# Patient Record
Sex: Male | Born: 1950
Health system: Southern US, Community
[De-identification: ages and names within clinical notes are randomized; demographics above are authoritative.]

## PROBLEM LIST (undated history)

## (undated) DIAGNOSIS — E7800 Pure hypercholesterolemia, unspecified: Secondary | ICD-10-CM

## (undated) DIAGNOSIS — I639 Cerebral infarction, unspecified: Secondary | ICD-10-CM

## (undated) DIAGNOSIS — M179 Osteoarthritis of knee, unspecified: Secondary | ICD-10-CM

## (undated) DIAGNOSIS — R0600 Dyspnea, unspecified: Secondary | ICD-10-CM

## (undated) DIAGNOSIS — Z860100 Personal history of colon polyps, unspecified: Secondary | ICD-10-CM

## (undated) DIAGNOSIS — M1710 Unilateral primary osteoarthritis, unspecified knee: Secondary | ICD-10-CM

## (undated) DIAGNOSIS — E663 Overweight: Secondary | ICD-10-CM

## (undated) DIAGNOSIS — R0609 Other forms of dyspnea: Secondary | ICD-10-CM

## (undated) DIAGNOSIS — M170 Bilateral primary osteoarthritis of knee: Secondary | ICD-10-CM

## (undated) DIAGNOSIS — I771 Stricture of artery: Secondary | ICD-10-CM

## (undated) DIAGNOSIS — M4800 Spinal stenosis, site unspecified: Secondary | ICD-10-CM

## (undated) DIAGNOSIS — R202 Paresthesia of skin: Secondary | ICD-10-CM

## (undated) DIAGNOSIS — D229 Melanocytic nevi, unspecified: Secondary | ICD-10-CM

## (undated) DIAGNOSIS — H9190 Unspecified hearing loss, unspecified ear: Secondary | ICD-10-CM

## (undated) DIAGNOSIS — I10 Essential (primary) hypertension: Secondary | ICD-10-CM

## (undated) DIAGNOSIS — U071 COVID-19: Secondary | ICD-10-CM

## (undated) HISTORY — DX: Overweight: E66.3

## (undated) HISTORY — PX: APPENDECTOMY: SHX54

## (undated) HISTORY — DX: Essential (primary) hypertension: I10

## (undated) HISTORY — DX: Melanocytic nevi, unspecified: D22.9

## (undated) HISTORY — PX: HERNIA REPAIR: SHX51

## (undated) HISTORY — PX: CARDIAC CATHETERIZATION: SHX172

## (undated) HISTORY — DX: Dyspnea, unspecified: R06.00

## (undated) HISTORY — DX: Personal history of colonic polyps: Z86.010

## (undated) HISTORY — DX: Unilateral primary osteoarthritis, unspecified knee: M17.10

## (undated) HISTORY — PX: KNEE SURGERY: SHX244

## (undated) HISTORY — DX: Other forms of dyspnea: R06.09

## (undated) HISTORY — DX: Osteoarthritis of knee, unspecified: M17.9

## (undated) HISTORY — DX: Paresthesia of skin: R20.2

## (undated) HISTORY — DX: Stricture of artery: I77.1

## (undated) HISTORY — DX: Personal history of colon polyps, unspecified: Z86.0100

## (undated) HISTORY — DX: COVID-19: U07.1

## (undated) HISTORY — DX: Bilateral primary osteoarthritis of knee: M17.0

## (undated) HISTORY — DX: Unspecified hearing loss, unspecified ear: H91.90

## (undated) HISTORY — DX: Spinal stenosis, site unspecified: M48.00

## (undated) HISTORY — DX: Pure hypercholesterolemia, unspecified: E78.00

## (undated) HISTORY — PX: SHOULDER SURGERY: SHX246

---

## 2016-10-07 DIAGNOSIS — M545 Low back pain: Secondary | ICD-10-CM | POA: Diagnosis not present

## 2016-10-07 DIAGNOSIS — Z1389 Encounter for screening for other disorder: Secondary | ICD-10-CM | POA: Diagnosis not present

## 2016-10-07 DIAGNOSIS — Z23 Encounter for immunization: Secondary | ICD-10-CM | POA: Diagnosis not present

## 2016-10-07 DIAGNOSIS — E7800 Pure hypercholesterolemia, unspecified: Secondary | ICD-10-CM | POA: Diagnosis not present

## 2016-10-07 DIAGNOSIS — Z0000 Encounter for general adult medical examination without abnormal findings: Secondary | ICD-10-CM | POA: Diagnosis not present

## 2016-10-07 DIAGNOSIS — Z125 Encounter for screening for malignant neoplasm of prostate: Secondary | ICD-10-CM | POA: Diagnosis not present

## 2016-10-07 DIAGNOSIS — I10 Essential (primary) hypertension: Secondary | ICD-10-CM | POA: Diagnosis not present

## 2017-03-16 DIAGNOSIS — Z23 Encounter for immunization: Secondary | ICD-10-CM | POA: Diagnosis not present

## 2017-03-31 DIAGNOSIS — H2513 Age-related nuclear cataract, bilateral: Secondary | ICD-10-CM | POA: Diagnosis not present

## 2017-03-31 DIAGNOSIS — H02831 Dermatochalasis of right upper eyelid: Secondary | ICD-10-CM | POA: Diagnosis not present

## 2017-04-25 DIAGNOSIS — I10 Essential (primary) hypertension: Secondary | ICD-10-CM | POA: Diagnosis not present

## 2017-04-25 DIAGNOSIS — E7800 Pure hypercholesterolemia, unspecified: Secondary | ICD-10-CM | POA: Diagnosis not present

## 2017-04-25 DIAGNOSIS — E663 Overweight: Secondary | ICD-10-CM | POA: Diagnosis not present

## 2017-04-25 DIAGNOSIS — R739 Hyperglycemia, unspecified: Secondary | ICD-10-CM | POA: Diagnosis not present

## 2017-04-27 DIAGNOSIS — R739 Hyperglycemia, unspecified: Secondary | ICD-10-CM | POA: Diagnosis not present

## 2018-02-15 DIAGNOSIS — Z23 Encounter for immunization: Secondary | ICD-10-CM | POA: Diagnosis not present

## 2018-05-22 DIAGNOSIS — I10 Essential (primary) hypertension: Secondary | ICD-10-CM | POA: Diagnosis not present

## 2018-05-22 DIAGNOSIS — R7301 Impaired fasting glucose: Secondary | ICD-10-CM | POA: Diagnosis not present

## 2018-05-22 DIAGNOSIS — E7800 Pure hypercholesterolemia, unspecified: Secondary | ICD-10-CM | POA: Diagnosis not present

## 2018-07-11 ENCOUNTER — Other Ambulatory Visit: Payer: Self-pay | Admitting: Internal Medicine

## 2018-07-11 DIAGNOSIS — Z0000 Encounter for general adult medical examination without abnormal findings: Secondary | ICD-10-CM | POA: Diagnosis not present

## 2018-07-11 DIAGNOSIS — R0989 Other specified symptoms and signs involving the circulatory and respiratory systems: Secondary | ICD-10-CM

## 2018-07-11 DIAGNOSIS — M48061 Spinal stenosis, lumbar region without neurogenic claudication: Secondary | ICD-10-CM | POA: Diagnosis not present

## 2018-07-11 DIAGNOSIS — Z1389 Encounter for screening for other disorder: Secondary | ICD-10-CM | POA: Diagnosis not present

## 2018-07-11 DIAGNOSIS — E663 Overweight: Secondary | ICD-10-CM | POA: Diagnosis not present

## 2018-07-11 DIAGNOSIS — Z125 Encounter for screening for malignant neoplasm of prostate: Secondary | ICD-10-CM | POA: Diagnosis not present

## 2018-07-12 ENCOUNTER — Ambulatory Visit
Admission: RE | Admit: 2018-07-12 | Discharge: 2018-07-12 | Disposition: A | Discharge status: Home / Self Care | Payer: Medicare PPO | Source: Ambulatory Visit | Attending: Internal Medicine | Admitting: Internal Medicine

## 2018-07-12 DIAGNOSIS — R0989 Other specified symptoms and signs involving the circulatory and respiratory systems: Secondary | ICD-10-CM

## 2018-07-12 DIAGNOSIS — I6523 Occlusion and stenosis of bilateral carotid arteries: Secondary | ICD-10-CM | POA: Diagnosis not present

## 2018-07-13 DIAGNOSIS — I771 Stricture of artery: Secondary | ICD-10-CM | POA: Diagnosis not present

## 2019-04-19 DIAGNOSIS — F4321 Adjustment disorder with depressed mood: Secondary | ICD-10-CM | POA: Diagnosis not present

## 2019-04-19 DIAGNOSIS — I10 Essential (primary) hypertension: Secondary | ICD-10-CM | POA: Diagnosis not present

## 2019-04-19 DIAGNOSIS — E785 Hyperlipidemia, unspecified: Secondary | ICD-10-CM | POA: Diagnosis not present

## 2019-04-19 DIAGNOSIS — Z6828 Body mass index (BMI) 28.0-28.9, adult: Secondary | ICD-10-CM | POA: Diagnosis not present

## 2019-04-19 DIAGNOSIS — H9193 Unspecified hearing loss, bilateral: Secondary | ICD-10-CM | POA: Diagnosis not present

## 2019-07-31 DIAGNOSIS — Z125 Encounter for screening for malignant neoplasm of prostate: Secondary | ICD-10-CM | POA: Diagnosis not present

## 2019-07-31 DIAGNOSIS — M9901 Segmental and somatic dysfunction of cervical region: Secondary | ICD-10-CM | POA: Diagnosis not present

## 2019-07-31 DIAGNOSIS — M62838 Other muscle spasm: Secondary | ICD-10-CM | POA: Diagnosis not present

## 2019-07-31 DIAGNOSIS — M542 Cervicalgia: Secondary | ICD-10-CM | POA: Diagnosis not present

## 2019-07-31 DIAGNOSIS — Z86010 Personal history of colonic polyps: Secondary | ICD-10-CM | POA: Diagnosis not present

## 2019-07-31 DIAGNOSIS — Z0000 Encounter for general adult medical examination without abnormal findings: Secondary | ICD-10-CM | POA: Diagnosis not present

## 2019-07-31 DIAGNOSIS — R3915 Urgency of urination: Secondary | ICD-10-CM | POA: Diagnosis not present

## 2019-07-31 DIAGNOSIS — I10 Essential (primary) hypertension: Secondary | ICD-10-CM | POA: Diagnosis not present

## 2019-07-31 DIAGNOSIS — M9902 Segmental and somatic dysfunction of thoracic region: Secondary | ICD-10-CM | POA: Diagnosis not present

## 2019-07-31 DIAGNOSIS — M545 Low back pain: Secondary | ICD-10-CM | POA: Diagnosis not present

## 2019-07-31 DIAGNOSIS — M62830 Muscle spasm of back: Secondary | ICD-10-CM | POA: Diagnosis not present

## 2019-07-31 DIAGNOSIS — M546 Pain in thoracic spine: Secondary | ICD-10-CM | POA: Diagnosis not present

## 2019-07-31 DIAGNOSIS — E7800 Pure hypercholesterolemia, unspecified: Secondary | ICD-10-CM | POA: Diagnosis not present

## 2019-07-31 DIAGNOSIS — I771 Stricture of artery: Secondary | ICD-10-CM | POA: Diagnosis not present

## 2019-07-31 DIAGNOSIS — Z1389 Encounter for screening for other disorder: Secondary | ICD-10-CM | POA: Diagnosis not present

## 2019-07-31 DIAGNOSIS — M9903 Segmental and somatic dysfunction of lumbar region: Secondary | ICD-10-CM | POA: Diagnosis not present

## 2019-08-07 DIAGNOSIS — M62830 Muscle spasm of back: Secondary | ICD-10-CM | POA: Diagnosis not present

## 2019-08-07 DIAGNOSIS — M9903 Segmental and somatic dysfunction of lumbar region: Secondary | ICD-10-CM | POA: Diagnosis not present

## 2019-08-07 DIAGNOSIS — M62838 Other muscle spasm: Secondary | ICD-10-CM | POA: Diagnosis not present

## 2019-08-07 DIAGNOSIS — M545 Low back pain: Secondary | ICD-10-CM | POA: Diagnosis not present

## 2019-08-07 DIAGNOSIS — M542 Cervicalgia: Secondary | ICD-10-CM | POA: Diagnosis not present

## 2019-08-07 DIAGNOSIS — M9901 Segmental and somatic dysfunction of cervical region: Secondary | ICD-10-CM | POA: Diagnosis not present

## 2019-08-07 DIAGNOSIS — M9902 Segmental and somatic dysfunction of thoracic region: Secondary | ICD-10-CM | POA: Diagnosis not present

## 2019-08-07 DIAGNOSIS — M546 Pain in thoracic spine: Secondary | ICD-10-CM | POA: Diagnosis not present

## 2019-08-08 DIAGNOSIS — M9902 Segmental and somatic dysfunction of thoracic region: Secondary | ICD-10-CM | POA: Diagnosis not present

## 2019-08-08 DIAGNOSIS — M546 Pain in thoracic spine: Secondary | ICD-10-CM | POA: Diagnosis not present

## 2019-08-08 DIAGNOSIS — M542 Cervicalgia: Secondary | ICD-10-CM | POA: Diagnosis not present

## 2019-08-08 DIAGNOSIS — M9903 Segmental and somatic dysfunction of lumbar region: Secondary | ICD-10-CM | POA: Diagnosis not present

## 2019-08-08 DIAGNOSIS — M62838 Other muscle spasm: Secondary | ICD-10-CM | POA: Diagnosis not present

## 2019-08-08 DIAGNOSIS — M9901 Segmental and somatic dysfunction of cervical region: Secondary | ICD-10-CM | POA: Diagnosis not present

## 2019-08-08 DIAGNOSIS — M545 Low back pain: Secondary | ICD-10-CM | POA: Diagnosis not present

## 2019-08-08 DIAGNOSIS — M62830 Muscle spasm of back: Secondary | ICD-10-CM | POA: Diagnosis not present

## 2019-08-13 DIAGNOSIS — M546 Pain in thoracic spine: Secondary | ICD-10-CM | POA: Diagnosis not present

## 2019-08-13 DIAGNOSIS — M542 Cervicalgia: Secondary | ICD-10-CM | POA: Diagnosis not present

## 2019-08-13 DIAGNOSIS — M62838 Other muscle spasm: Secondary | ICD-10-CM | POA: Diagnosis not present

## 2019-08-13 DIAGNOSIS — M9901 Segmental and somatic dysfunction of cervical region: Secondary | ICD-10-CM | POA: Diagnosis not present

## 2019-08-13 DIAGNOSIS — M62830 Muscle spasm of back: Secondary | ICD-10-CM | POA: Diagnosis not present

## 2019-08-13 DIAGNOSIS — M9902 Segmental and somatic dysfunction of thoracic region: Secondary | ICD-10-CM | POA: Diagnosis not present

## 2019-08-13 DIAGNOSIS — M545 Low back pain: Secondary | ICD-10-CM | POA: Diagnosis not present

## 2019-08-13 DIAGNOSIS — M9903 Segmental and somatic dysfunction of lumbar region: Secondary | ICD-10-CM | POA: Diagnosis not present

## 2019-08-15 DIAGNOSIS — M62838 Other muscle spasm: Secondary | ICD-10-CM | POA: Diagnosis not present

## 2019-08-15 DIAGNOSIS — M545 Low back pain: Secondary | ICD-10-CM | POA: Diagnosis not present

## 2019-08-15 DIAGNOSIS — M9901 Segmental and somatic dysfunction of cervical region: Secondary | ICD-10-CM | POA: Diagnosis not present

## 2019-08-15 DIAGNOSIS — M9903 Segmental and somatic dysfunction of lumbar region: Secondary | ICD-10-CM | POA: Diagnosis not present

## 2019-08-15 DIAGNOSIS — M9902 Segmental and somatic dysfunction of thoracic region: Secondary | ICD-10-CM | POA: Diagnosis not present

## 2019-08-15 DIAGNOSIS — M546 Pain in thoracic spine: Secondary | ICD-10-CM | POA: Diagnosis not present

## 2019-08-15 DIAGNOSIS — M62830 Muscle spasm of back: Secondary | ICD-10-CM | POA: Diagnosis not present

## 2019-08-15 DIAGNOSIS — M542 Cervicalgia: Secondary | ICD-10-CM | POA: Diagnosis not present

## 2019-08-20 DIAGNOSIS — M545 Low back pain: Secondary | ICD-10-CM | POA: Diagnosis not present

## 2019-08-20 DIAGNOSIS — M62830 Muscle spasm of back: Secondary | ICD-10-CM | POA: Diagnosis not present

## 2019-08-20 DIAGNOSIS — M9901 Segmental and somatic dysfunction of cervical region: Secondary | ICD-10-CM | POA: Diagnosis not present

## 2019-08-20 DIAGNOSIS — M546 Pain in thoracic spine: Secondary | ICD-10-CM | POA: Diagnosis not present

## 2019-08-20 DIAGNOSIS — M542 Cervicalgia: Secondary | ICD-10-CM | POA: Diagnosis not present

## 2019-08-20 DIAGNOSIS — M62838 Other muscle spasm: Secondary | ICD-10-CM | POA: Diagnosis not present

## 2019-08-20 DIAGNOSIS — M9903 Segmental and somatic dysfunction of lumbar region: Secondary | ICD-10-CM | POA: Diagnosis not present

## 2019-08-20 DIAGNOSIS — M9902 Segmental and somatic dysfunction of thoracic region: Secondary | ICD-10-CM | POA: Diagnosis not present

## 2019-08-22 DIAGNOSIS — M545 Low back pain: Secondary | ICD-10-CM | POA: Diagnosis not present

## 2019-08-22 DIAGNOSIS — M9902 Segmental and somatic dysfunction of thoracic region: Secondary | ICD-10-CM | POA: Diagnosis not present

## 2019-08-22 DIAGNOSIS — M546 Pain in thoracic spine: Secondary | ICD-10-CM | POA: Diagnosis not present

## 2019-08-22 DIAGNOSIS — M9903 Segmental and somatic dysfunction of lumbar region: Secondary | ICD-10-CM | POA: Diagnosis not present

## 2019-08-22 DIAGNOSIS — M62838 Other muscle spasm: Secondary | ICD-10-CM | POA: Diagnosis not present

## 2019-08-22 DIAGNOSIS — M9901 Segmental and somatic dysfunction of cervical region: Secondary | ICD-10-CM | POA: Diagnosis not present

## 2019-08-22 DIAGNOSIS — M62830 Muscle spasm of back: Secondary | ICD-10-CM | POA: Diagnosis not present

## 2019-08-22 DIAGNOSIS — M542 Cervicalgia: Secondary | ICD-10-CM | POA: Diagnosis not present

## 2019-08-27 DIAGNOSIS — M9902 Segmental and somatic dysfunction of thoracic region: Secondary | ICD-10-CM | POA: Diagnosis not present

## 2019-08-27 DIAGNOSIS — M545 Low back pain: Secondary | ICD-10-CM | POA: Diagnosis not present

## 2019-08-27 DIAGNOSIS — M9903 Segmental and somatic dysfunction of lumbar region: Secondary | ICD-10-CM | POA: Diagnosis not present

## 2019-08-27 DIAGNOSIS — M546 Pain in thoracic spine: Secondary | ICD-10-CM | POA: Diagnosis not present

## 2019-08-27 DIAGNOSIS — M9901 Segmental and somatic dysfunction of cervical region: Secondary | ICD-10-CM | POA: Diagnosis not present

## 2019-08-27 DIAGNOSIS — M62838 Other muscle spasm: Secondary | ICD-10-CM | POA: Diagnosis not present

## 2019-08-27 DIAGNOSIS — M62830 Muscle spasm of back: Secondary | ICD-10-CM | POA: Diagnosis not present

## 2019-08-27 DIAGNOSIS — M542 Cervicalgia: Secondary | ICD-10-CM | POA: Diagnosis not present

## 2019-08-29 DIAGNOSIS — M546 Pain in thoracic spine: Secondary | ICD-10-CM | POA: Diagnosis not present

## 2019-08-29 DIAGNOSIS — M542 Cervicalgia: Secondary | ICD-10-CM | POA: Diagnosis not present

## 2019-08-29 DIAGNOSIS — M9903 Segmental and somatic dysfunction of lumbar region: Secondary | ICD-10-CM | POA: Diagnosis not present

## 2019-08-29 DIAGNOSIS — M9901 Segmental and somatic dysfunction of cervical region: Secondary | ICD-10-CM | POA: Diagnosis not present

## 2019-08-29 DIAGNOSIS — M62830 Muscle spasm of back: Secondary | ICD-10-CM | POA: Diagnosis not present

## 2019-08-29 DIAGNOSIS — M9902 Segmental and somatic dysfunction of thoracic region: Secondary | ICD-10-CM | POA: Diagnosis not present

## 2019-08-29 DIAGNOSIS — M62838 Other muscle spasm: Secondary | ICD-10-CM | POA: Diagnosis not present

## 2019-08-29 DIAGNOSIS — M545 Low back pain: Secondary | ICD-10-CM | POA: Diagnosis not present

## 2019-09-05 DIAGNOSIS — M9902 Segmental and somatic dysfunction of thoracic region: Secondary | ICD-10-CM | POA: Diagnosis not present

## 2019-09-05 DIAGNOSIS — M9903 Segmental and somatic dysfunction of lumbar region: Secondary | ICD-10-CM | POA: Diagnosis not present

## 2019-09-05 DIAGNOSIS — M545 Low back pain: Secondary | ICD-10-CM | POA: Diagnosis not present

## 2019-09-05 DIAGNOSIS — M62838 Other muscle spasm: Secondary | ICD-10-CM | POA: Diagnosis not present

## 2019-09-05 DIAGNOSIS — M9901 Segmental and somatic dysfunction of cervical region: Secondary | ICD-10-CM | POA: Diagnosis not present

## 2019-09-05 DIAGNOSIS — M62830 Muscle spasm of back: Secondary | ICD-10-CM | POA: Diagnosis not present

## 2019-09-05 DIAGNOSIS — M546 Pain in thoracic spine: Secondary | ICD-10-CM | POA: Diagnosis not present

## 2019-09-05 DIAGNOSIS — M542 Cervicalgia: Secondary | ICD-10-CM | POA: Diagnosis not present

## 2020-02-18 DIAGNOSIS — Z20828 Contact with and (suspected) exposure to other viral communicable diseases: Secondary | ICD-10-CM | POA: Diagnosis not present

## 2020-02-26 DIAGNOSIS — T162XXA Foreign body in left ear, initial encounter: Secondary | ICD-10-CM | POA: Diagnosis not present

## 2020-04-08 DIAGNOSIS — M1712 Unilateral primary osteoarthritis, left knee: Secondary | ICD-10-CM | POA: Diagnosis not present

## 2020-04-08 DIAGNOSIS — S83512A Sprain of anterior cruciate ligament of left knee, initial encounter: Secondary | ICD-10-CM | POA: Diagnosis not present

## 2020-04-22 DIAGNOSIS — S83512D Sprain of anterior cruciate ligament of left knee, subsequent encounter: Secondary | ICD-10-CM | POA: Diagnosis not present

## 2020-05-06 DIAGNOSIS — M1712 Unilateral primary osteoarthritis, left knee: Secondary | ICD-10-CM | POA: Diagnosis not present

## 2020-05-06 DIAGNOSIS — M25561 Pain in right knee: Secondary | ICD-10-CM | POA: Diagnosis not present

## 2020-06-21 DIAGNOSIS — U071 COVID-19: Secondary | ICD-10-CM | POA: Diagnosis not present

## 2020-06-21 DIAGNOSIS — R509 Fever, unspecified: Secondary | ICD-10-CM | POA: Diagnosis not present

## 2020-06-21 DIAGNOSIS — J029 Acute pharyngitis, unspecified: Secondary | ICD-10-CM | POA: Diagnosis not present

## 2020-06-21 DIAGNOSIS — R059 Cough, unspecified: Secondary | ICD-10-CM | POA: Diagnosis not present

## 2020-07-10 DIAGNOSIS — U071 COVID-19: Secondary | ICD-10-CM | POA: Diagnosis not present

## 2020-07-10 DIAGNOSIS — G933 Postviral fatigue syndrome: Secondary | ICD-10-CM | POA: Diagnosis not present

## 2020-08-14 ENCOUNTER — Ambulatory Visit
Admission: RE | Admit: 2020-08-14 | Discharge: 2020-08-14 | Disposition: A | Discharge status: Home / Self Care | Payer: Medicare PPO | Source: Ambulatory Visit | Attending: Family Medicine | Admitting: Family Medicine

## 2020-08-14 ENCOUNTER — Other Ambulatory Visit: Payer: Self-pay | Admitting: Family Medicine

## 2020-08-14 DIAGNOSIS — R0609 Other forms of dyspnea: Secondary | ICD-10-CM

## 2020-08-14 DIAGNOSIS — R0602 Shortness of breath: Secondary | ICD-10-CM | POA: Diagnosis not present

## 2020-08-14 DIAGNOSIS — Z23 Encounter for immunization: Secondary | ICD-10-CM | POA: Diagnosis not present

## 2020-08-14 DIAGNOSIS — R0600 Dyspnea, unspecified: Secondary | ICD-10-CM | POA: Diagnosis not present

## 2020-08-14 DIAGNOSIS — D229 Melanocytic nevi, unspecified: Secondary | ICD-10-CM | POA: Diagnosis not present

## 2020-08-14 DIAGNOSIS — I10 Essential (primary) hypertension: Secondary | ICD-10-CM | POA: Diagnosis not present

## 2020-08-14 DIAGNOSIS — Z0000 Encounter for general adult medical examination without abnormal findings: Secondary | ICD-10-CM | POA: Diagnosis not present

## 2020-08-14 DIAGNOSIS — E7800 Pure hypercholesterolemia, unspecified: Secondary | ICD-10-CM | POA: Diagnosis not present

## 2020-08-14 DIAGNOSIS — M170 Bilateral primary osteoarthritis of knee: Secondary | ICD-10-CM | POA: Diagnosis not present

## 2020-08-14 DIAGNOSIS — Z1159 Encounter for screening for other viral diseases: Secondary | ICD-10-CM | POA: Diagnosis not present

## 2020-08-14 DIAGNOSIS — Z125 Encounter for screening for malignant neoplasm of prostate: Secondary | ICD-10-CM | POA: Diagnosis not present

## 2020-08-14 DIAGNOSIS — R202 Paresthesia of skin: Secondary | ICD-10-CM | POA: Diagnosis not present

## 2020-08-25 ENCOUNTER — Telehealth: Payer: Self-pay

## 2020-08-25 NOTE — Telephone Encounter (Signed)
NOTES ON FILE FROM [facility] [phone], REFERRAL IN PROFICIENT

## 2020-08-27 ENCOUNTER — Telehealth: Payer: Self-pay

## 2020-08-27 NOTE — Telephone Encounter (Signed)
NOTES ON FILE FROM DR RACHEAL HAGER, REFERRAL IN PRIFICIENT

## 2020-08-29 ENCOUNTER — Other Ambulatory Visit: Payer: Self-pay

## 2020-08-29 ENCOUNTER — Encounter: Payer: Self-pay | Admitting: Cardiology

## 2020-08-29 ENCOUNTER — Ambulatory Visit: Payer: Medicare HMO | Admitting: Cardiology

## 2020-08-29 VITALS — BP 140/90 | HR 67 | Ht 71.0 in | Wt 209.0 lb

## 2020-08-29 DIAGNOSIS — E7800 Pure hypercholesterolemia, unspecified: Secondary | ICD-10-CM

## 2020-08-29 DIAGNOSIS — I10 Essential (primary) hypertension: Secondary | ICD-10-CM

## 2020-08-29 DIAGNOSIS — R0789 Other chest pain: Secondary | ICD-10-CM | POA: Diagnosis not present

## 2020-08-29 DIAGNOSIS — R0600 Dyspnea, unspecified: Secondary | ICD-10-CM

## 2020-08-29 DIAGNOSIS — Z79899 Other long term (current) drug therapy: Secondary | ICD-10-CM

## 2020-08-29 DIAGNOSIS — R0609 Other forms of dyspnea: Secondary | ICD-10-CM

## 2020-08-29 MED ORDER — METOPROLOL TARTRATE 100 MG PO TABS: ORAL_TABLET | ORAL | 0 refills | Status: DC

## 2020-08-29 NOTE — Patient Instructions (Signed)
Medication Instructions:  Your physician recommends that you continue on your current medications as directed. Please refer to the Current Medication list given to you today.  *If you need a refill on your cardiac medications before your next appointment, please call your pharmacy*   Lab Work BMET prior to your CT  If you have labs (blood work) drawn today and your tests are completely normal, you will receive your results only by: Marland Kitchen MyChart Message (if you have MyChart) OR . A paper copy in the mail If you have any lab test that is abnormal or we need to change your treatment, we will call you to review the results.   Testing/Procedures: Your physician has requested that you have an echocardiogram. Echocardiography is a painless test that uses sound waves to create images of your heart. It provides your doctor with information about the size and shape of your heart and how well your heart's chambers and valves are working. This procedure takes approximately one hour. There are no restrictions for this procedure.  Your cardiac CT will be scheduled at one of the below locations:   [facility] [address] [place], [place] [address] [phone]  [place] [address] [place], [place] [address] [phone]  If scheduled at [facility], please arrive at the [facility] main entrance (entrance A) of [facility] 30 minutes prior to test start time. Proceed to the [facility] Radiology Department (first floor) to check-in and test prep.  If scheduled at [facility], please arrive 15 mins early for check-in and test prep.  Please follow these instructions carefully (unless otherwise directed):   On the Night Before the Test: . Be sure to Drink plenty of water. . Do not consume any caffeinated/decaffeinated beverages or chocolate 12 hours prior to your test. . Do not take  any antihistamines 12 hours prior to your test.  On the Day of the Test: . Drink plenty of water until 1 hour prior to the test. . Do not eat any food 4 hours prior to the test. . You may take your regular medications prior to the test.  . Take metoprolol (Lopressor) two hours prior to test. . HOLD Furosemide/Hydrochlorothiazide morning of the test.  After the Test: . Drink plenty of water. . After receiving IV contrast, you may experience a mild flushed feeling. This is normal. . On occasion, you may experience a mild rash up to 24 hours after the test. This is not dangerous. If this occurs, you can take Benadryl 25 mg and increase your fluid intake. . If you experience trouble breathing, this can be serious. If it is severe call 911 IMMEDIATELY. If it is mild, please call our office. . If you take any of these medications: Glipizide/Metformin, Avandament, Glucavance, please do not take 48 hours after completing test unless otherwise instructed.   Once we have confirmed authorization from your insurance company, we will call you to set up a date and time for your test. Based on how quickly your insurance processes prior authorizations requests, please allow up to 4 weeks to be contacted for scheduling your Cardiac CT appointment. Be advised that routine Cardiac CT appointments could be scheduled as many as 8 weeks after your provider has ordered it.  For non-scheduling related questions, please contact the cardiac imaging nurse navigator should you have any questions/concerns: Marchia Bond, Cardiac Imaging Nurse Navigator Gordy Clement, Cardiac Imaging Nurse  Navigator [facility] Direct Office Dial: [phone]   For scheduling needs, including cancellations and rescheduling, please call [place], [phone].     Follow-Up: At [facility], you and your health needs are our priority.  As part of our continuing mission to provide you with exceptional  heart care, we have created designated Provider Care Teams.  These Care Teams include your primary Cardiologist (physician) and Advanced Practice Providers (APPs -  Physician Assistants and Nurse Practitioners) who all work together to provide you with the care you need, when you need it.  We recommend signing up for the patient portal called "MyChart".  Sign up information is provided on this After Visit Summary.  MyChart is used to connect with patients for Virtual Visits (Telemedicine).  Patients are able to view lab/test results, encounter notes, upcoming appointments, etc.  Non-urgent messages can be sent to your provider as well.   To learn more about what you can do with MyChart, go to [email or web address].    Your next appointment:   8 month(s)  The format for your next appointment:   In Person  Provider:   Gwyndolyn Kaufman, MD   Other Instructions

## 2020-08-29 NOTE — Progress Notes (Signed)
Cardiology Office Note:    Date:  08/29/2020   ID:  [name], DOB 27-Mar-1951, MRN [id number]  PCP:  Caren Macadam, [place]  Cardiologist:  No primary care provider on file.  Advanced Practice Provider:  No care team member to display Electrophysiologist:  None    Referring MD: Caren Macadam, MD     History of Present Illness:    [name] is a 70 y.o. male with a hx of HTN, HLD,and subclavian stenosis who was referred by Dr. Mannie Stabile for further evaluation of dyspnea on exertion.  The patient states he has noticed more shortness of breath with exertion over the past several months as well as decreased exercise capacity. Used to be able to bike 65 miles in a day 2 years ago and now he is more SOB when walking an incline. No chest pain, lightheadedness, LE edema, orthopnea, PND. Still works out 3 days a week for 2 hours and tries to get 10,000 steps per day but he notices that he is much more fatigued with activity compared to where he used to be. No chest pain, orthopnea, PND, palpitations, lightheadedness or dizziness.  Had prior cath about 12 years ago after he was very weak with shoveling the snow which was reassuringly normal.   Family history: Father with CAD; brother with HLD and HTN  LDL 102, HDL 40, TG 143, A1C 5.4. BNP normal.  Past Medical History:  Diagnosis Date  . COVID   . DJD (degenerative joint disease) of knee   . Dyspnea on exertion   . Hearing loss   . Hypercholesteremia   . Hypertension   . Overweight   . Paresthesias   . Personal history of colonic polyps   . Primary osteoarthritis of both knees   . Skin mole   . Spinal stenosis   . Stenosis of right subclavian artery (HCC)     Current Medications: Current Meds  Medication Sig  . Apple Cider Vinegar 600 MG CAPS Take by mouth.  Marland Kitchen aspirin EC 81 MG tablet Take 81 mg by mouth daily. Swallow whole.  . Cholecalciferol 125 MCG (5000 UT) capsule Take 5,000  Units by mouth daily.  . Glucosamine-Chondroit-Vit C-Mn (GLUCOSAMINE 1500 COMPLEX PO) Take by mouth.  . hydrochlorothiazide (HYDRODIURIL) 12.5 MG tablet Take 12.5 mg by mouth daily.  Marland Kitchen losartan (COZAAR) 100 MG tablet Take 100 mg by mouth daily.  . Lutein-Zeaxanthin 25-5 MG CAPS Take by mouth.  . Magnesium 400 MG CAPS Take by mouth.  . metoprolol tartrate (LOPRESSOR) 100 MG tablet Take one tablet 2 hours prior to your Cardiac CT.  . Multiple Vitamin (MULTIVITAMIN) tablet Take 1 tablet by mouth daily.  . simvastatin (ZOCOR) 20 MG tablet Take 20 mg by mouth daily.     Allergies:   Patient has no known allergies.   Social History   Socioeconomic History  . Marital status: Married    Spouse name: Not on file  . Number of children: Not on file  . Years of education: Not on file  . Highest education level: Not on file  Occupational History  . Not on file  Tobacco Use  . Smoking status: Never Smoker  . Smokeless tobacco: Never Used  Substance and Sexual Activity  . Alcohol use: Yes  . Drug use: Never  . Sexual activity: Not on file  Other Topics Concern  . Not on file  Social History Narrative  . Not on file  Social Determinants of Health   Financial Resource Strain: Not on file  Food Insecurity: Not on file  Transportation Needs: Not on file  Physical Activity: Not on file  Stress: Not on file  Social Connections: Not on file     Family History: The patient's family history includes CVA in his brother.  ROS:   Please see the history of present illness.    Review of Systems  Constitutional: Negative for chills and fever.  HENT: Negative for hearing loss and sore throat.   Eyes: Negative for blurred vision and redness.  Respiratory: Positive for shortness of breath.   Cardiovascular: Negative for chest pain, palpitations, orthopnea, claudication, leg swelling and PND.  Gastrointestinal: Negative for melena, nausea and vomiting.  Genitourinary: Negative for dysuria and  flank pain.  Musculoskeletal: Negative for falls and myalgias.  Neurological: Negative for dizziness and loss of consciousness.  Endo/Heme/Allergies: Negative for polydipsia.  Psychiatric/Behavioral: Negative for substance abuse.    EKGs/Labs/Other Studies Reviewed:    The following studies were reviewed today: 07/12/18: IMPRESSION: Right: Color duplex indicates minimal heterogeneous and calcified plaque, with no hemodynamically significant stenosis by duplex criteria in the right extracranial cerebrovascular circulation.  Left: Color duplex indicates minimal heterogeneous and calcified plaque, with no hemodynamically significant stenosis by duplex criteria in the left extracranial cerebrovascular circulation.  Late systolic reversal of the right vertebral artery with antegrade flow in diastole. This waveform indicates subclavian artery or innominate artery stenosis proximal to the right vertebral artery origin. Further evaluation with CT a chest may be useful if there is concern for symptoms.  EKG:  EKG is  ordered today.  The ekg ordered today demonstrates NSR with HR 67  Recent Labs: No results found for requested labs within last 8760 hours.  Recent Lipid Panel No results found for: CHOL, TRIG, HDL, CHOLHDL, VLDL, LDLCALC, LDLDIRECT   Physical Exam:    VS:  BP 140/90 (BP Location: Left Arm, Patient Position: Sitting, Cuff Size: Normal)   Pulse 67   Ht 5\' 11"  (1.803 m)   Wt 209 lb (94.8 kg)   SpO2 97%   BMI 29.15 kg/m     Wt Readings from Last 3 Encounters:  08/29/20 209 lb (94.8 kg)     GEN:  Well nourished, well developed in no acute distress HEENT: Normal NECK: No JVD; No carotid bruits CARDIAC: RRR, no murmurs, rubs, gallops RESPIRATORY:  Clear to auscultation without rales, wheezing or rhonchi  ABDOMEN: Soft, non-tender, non-distended MUSCULOSKELETAL:  No edema; No deformity  SKIN: Warm and dry NEUROLOGIC:  Alert and oriented x 3 PSYCHIATRIC:  Normal  affect   ASSESSMENT:    1. Dyspnea on exertion   2. Other chest pain   3. Medication management   4. Primary hypertension   5. Pure hypercholesterolemia    PLAN:    In order of problems listed above:  #DOE: Patient with progressive dyspnea on exertion over the past several months and decreased exercise tolerance. Notes that he used to be able to bike ride for significant distances and now he gets so short of breath he has had to cut his mileage down significantly. Had prior cath about 12 years ago which did not show significant disease per report. -Check coronary CTA -Check TTE  #HTN:  Well controlled.  -HCTZ 12.5mg  daily -Losartan 100mg  daily  #HLD: -Continue simvastatin 20mg  daily--may need to adjust pending CTA findings     Medication Adjustments/Labs and Tests Ordered: Current medicines are reviewed at length with the  patient today.  Concerns regarding medicines are outlined above.  Orders Placed This Encounter  Procedures  . CT CORONARY FRACTIONAL FLOW RESERVE DATA PREP  . CT CORONARY FRACTIONAL FLOW RESERVE FLUID ANALYSIS  . CT CORONARY MORPH W/CTA COR W/SCORE W/CA W/CM &/OR WO/CM  . Basic metabolic panel  . EKG 12-Lead  . ECHOCARDIOGRAM COMPLETE   Meds ordered this encounter  Medications  . metoprolol tartrate (LOPRESSOR) 100 MG tablet    Sig: Take one tablet 2 hours prior to your Cardiac CT.    Dispense:  1 tablet    Refill:  0    Patient Instructions  Medication Instructions:  Your physician recommends that you continue on your current medications as directed. Please refer to the Current Medication list given to you today.  *If you need a refill on your cardiac medications before your next appointment, please call your pharmacy*   Lab Work BMET prior to your CT  If you have labs (blood work) drawn today and your tests are completely normal, you will receive your results only by: Marland Kitchen MyChart Message (if you have MyChart) OR . A paper copy in the  mail If you have any lab test that is abnormal or we need to change your treatment, we will call you to review the results.   Testing/Procedures: Your physician has requested that you have an echocardiogram. Echocardiography is a painless test that uses sound waves to create images of your heart. It provides your doctor with information about the size and shape of your heart and how well your heart's chambers and valves are working. This procedure takes approximately one hour. There are no restrictions for this procedure.  Your cardiac CT will be scheduled at one of the below locations:   [facility] [address] [place], [place] [address] [phone]  [place] [address] [place],  [address] [phone]  If scheduled at [facility], please arrive at the [facility] main entrance (entrance A) of [facility] 30 minutes prior to test start time. Proceed to the [facility] Radiology Department (first floor) to check-in and test prep.  If scheduled at [facility], please arrive 15 mins early for check-in and test prep.  Please follow these instructions carefully (unless otherwise directed):   On the Night Before the Test: . Be sure to Drink plenty of water. . Do not consume any caffeinated/decaffeinated beverages or chocolate 12 hours prior to your test. . Do not take any antihistamines 12 hours prior to your test.  On the Day of the Test: . Drink plenty of water until 1 hour prior to the test. . Do not eat any food 4 hours prior to the test. . You may take your regular medications prior to the test.  . Take metoprolol (Lopressor) two hours prior to test. . HOLD Furosemide/Hydrochlorothiazide morning of the test.  After the Test: . Drink plenty of water. . After receiving IV contrast, you may experience a mild flushed feeling. This is normal. . On occasion,  you may experience a mild rash up to 24 hours after the test. This is not dangerous. If this occurs, you can take Benadryl 25 mg and increase your fluid intake. . If you experience trouble breathing, this can be serious. If it is severe call 911 IMMEDIATELY. If it is mild, please call our office. . If you take any of these medications: Glipizide/Metformin, Avandament, Glucavance, please do  not take 48 hours after completing test unless otherwise instructed.   Once we have confirmed authorization from your insurance company, we will call you to set up a date and time for your test. Based on how quickly your insurance processes prior authorizations requests, please allow up to 4 weeks to be contacted for scheduling your Cardiac CT appointment. Be advised that routine Cardiac CT appointments could be scheduled as many as 8 weeks after your provider has ordered it.  For non-scheduling related questions, please contact the cardiac imaging nurse navigator should you have any questions/concerns: Marchia Bond, Cardiac Imaging Nurse Navigator Gordy Clement, Cardiac Imaging Nurse Navigator [facility] Direct Office Dial: [phone]   For scheduling needs, including cancellations and rescheduling, please call [place], [phone].     Follow-Up: At [facility], you and your health needs are our priority.  As part of our continuing mission to provide you with exceptional heart care, we have created designated Provider Care Teams.  These Care Teams include your primary Cardiologist (physician) and Advanced Practice Providers (APPs -  Physician Assistants and Nurse Practitioners) who all work together to provide you with the care you need, when you need it.  We recommend signing up for the patient portal called "MyChart".  Sign up information is provided on this After Visit Summary.  MyChart is used to connect with patients for Virtual Visits (Telemedicine).  Patients are  able to view lab/test results, encounter notes, upcoming appointments, etc.  Non-urgent messages can be sent to your provider as well.   To learn more about what you can do with MyChart, go to [email or web address].    Your next appointment:   8 month(s)  The format for your next appointment:   In Person  Provider:   Gwyndolyn Kaufman, MD   Other Instructions      Signed, Freada Bergeron, MD  08/29/2020 3:14 PM    [place]

## 2020-09-15 DIAGNOSIS — R748 Abnormal levels of other serum enzymes: Secondary | ICD-10-CM | POA: Diagnosis not present

## 2020-09-15 DIAGNOSIS — Z1211 Encounter for screening for malignant neoplasm of colon: Secondary | ICD-10-CM | POA: Diagnosis not present

## 2020-09-15 DIAGNOSIS — E7800 Pure hypercholesterolemia, unspecified: Secondary | ICD-10-CM | POA: Diagnosis not present

## 2020-09-15 DIAGNOSIS — I10 Essential (primary) hypertension: Secondary | ICD-10-CM | POA: Diagnosis not present

## 2020-09-16 ENCOUNTER — Other Ambulatory Visit: Payer: Medicare HMO

## 2020-09-16 ENCOUNTER — Telehealth: Payer: Self-pay | Admitting: *Deleted

## 2020-09-16 ENCOUNTER — Other Ambulatory Visit: Payer: Self-pay

## 2020-09-16 DIAGNOSIS — R0789 Other chest pain: Secondary | ICD-10-CM

## 2020-09-16 DIAGNOSIS — Z79899 Other long term (current) drug therapy: Secondary | ICD-10-CM | POA: Diagnosis not present

## 2020-09-16 LAB — BASIC METABOLIC PANEL
BUN/Creatinine Ratio: 22 (ref 10–24)
BUN: 23 mg/dL (ref 8–27)
CO2: 24 mmol/L (ref 20–29)
Calcium: 9.5 mg/dL (ref 8.6–10.2)
Chloride: 105 mmol/L (ref 96–106)
Creatinine, Ser: 1.05 mg/dL (ref 0.76–1.27)
Glucose: 100 mg/dL — ABNORMAL HIGH (ref 65–99)
Potassium: 4.9 mmol/L (ref 3.5–5.2)
Sodium: 144 mmol/L (ref 134–144)
eGFR: 76 mL/min/{1.73_m2} (ref >59)

## 2020-09-16 NOTE — Telephone Encounter (Signed)
Pt will come today 4/12 to the office for his BMET, for upcoming Cardiac CT on 4/14, per CT protocol. Pt verbalized understanding and agrees with this plan.

## 2020-09-16 NOTE — Telephone Encounter (Signed)
-----   Message from Eli Hose, RN sent at 09/16/2020  9:36 AM EDT ----- [name],  This pt of Dr. Johney Frame is having her CCTA this Thursday, April 14.  Can you reach out for him to come in for labs?  Thanks, Kerin Ransom

## 2020-09-17 ENCOUNTER — Telehealth (HOSPITAL_COMMUNITY): Payer: Self-pay | Admitting: *Deleted

## 2020-09-17 NOTE — Telephone Encounter (Signed)
Reaching out to patient to offer assistance regarding upcoming cardiac imaging study; pt verbalizes understanding of appt date/time, parking situation and where to check in, pre-test NPO status and medications ordered, and verified current allergies; name and call back number provided for further questions should they arise  Gordy Clement RN Navigator Cardiac Imaging [facility] [phone] office [phone] cell  Pt to take 100mg  metoprolol tartrate 2 hours prior to test.

## 2020-09-18 ENCOUNTER — Ambulatory Visit (HOSPITAL_COMMUNITY)
Admission: RE | Admit: 2020-09-18 | Discharge: 2020-09-18 | Disposition: A | Discharge status: Home / Self Care | Payer: Medicare HMO | Source: Ambulatory Visit | Attending: Cardiology | Admitting: Cardiology

## 2020-09-18 ENCOUNTER — Other Ambulatory Visit: Payer: Self-pay

## 2020-09-18 ENCOUNTER — Encounter (HOSPITAL_COMMUNITY): Payer: Self-pay

## 2020-09-18 DIAGNOSIS — I25118 Atherosclerotic heart disease of native coronary artery with other forms of angina pectoris: Secondary | ICD-10-CM | POA: Insufficient documentation

## 2020-09-18 DIAGNOSIS — R0789 Other chest pain: Secondary | ICD-10-CM | POA: Diagnosis not present

## 2020-09-18 DIAGNOSIS — I700 Atherosclerosis of aorta: Secondary | ICD-10-CM | POA: Diagnosis not present

## 2020-09-18 DIAGNOSIS — I2510 Atherosclerotic heart disease of native coronary artery without angina pectoris: Secondary | ICD-10-CM | POA: Diagnosis not present

## 2020-09-18 DIAGNOSIS — Z006 Encounter for examination for normal comparison and control in clinical research program: Secondary | ICD-10-CM

## 2020-09-18 MED ORDER — NITROGLYCERIN 0.4 MG SL SUBL
0.8000 mg | SUBLINGUAL_TABLET | Freq: Once | SUBLINGUAL | Status: AC
  Administered 2020-09-18: 0.8 mg via SUBLINGUAL

## 2020-09-18 MED ORDER — IOHEXOL 350 MG/ML SOLN
95.0000 mL | Freq: Once | INTRAVENOUS | Status: AC | PRN
  Administered 2020-09-18: 95 mL via INTRAVENOUS

## 2020-09-18 MED ORDER — NITROGLYCERIN 0.4 MG SL SUBL
SUBLINGUAL_TABLET | SUBLINGUAL | Status: AC
  Filled 2020-09-18: qty 2

## 2020-09-18 NOTE — Research (Signed)
IDENTIFY Informed Consent                  Subject Name: [name]   Subject met inclusion and exclusion criteria.  The informed consent form, study requirements and expectations were reviewed with the subject and questions and concerns were addressed prior to the signing of the consent form.  The subject verbalized understanding of the trial requirements.  The subject agreed to participate in the IDENTIFY trial and signed the informed consent at 08:27AM on 09/18/20.  The informed consent was obtained prior to performance of any protocol-specific procedures for the subject.  A copy of the signed informed consent was given to the subject and a copy was placed in the subject's medical record.   Meade Maw , Naval architect

## 2020-09-22 ENCOUNTER — Telehealth: Payer: Self-pay

## 2020-09-22 DIAGNOSIS — E7800 Pure hypercholesterolemia, unspecified: Secondary | ICD-10-CM

## 2020-09-22 MED ORDER — ROSUVASTATIN CALCIUM 20 MG PO TABS: 20.0000 mg | ORAL_TABLET | Freq: Every day | ORAL | 3 refills | Status: DC

## 2020-09-22 NOTE — Telephone Encounter (Signed)
Call back received from Pt.  Pt advised of CT results and Dr. Jacolyn Reedy recommendations.  Pt in agreement.  New prescription sent to pharmacy and follow up lab work scheduled.

## 2020-09-22 NOTE — Telephone Encounter (Signed)
-----   Message from Freada Bergeron, MD sent at 09/20/2020  8:23 AM EDT ----- His coronary CTA looks good with only mild plaque in his heart arteries but no blockage to flow. Given that there is mild disease present, we should switch him to a stronger statin. Can we stop his simvastatin and change him to crestor 20mg  daily and repeat lipids in 6-8 weeks?

## 2020-09-22 NOTE — Telephone Encounter (Signed)
Left message for Pt requesting call back to discuss results.

## 2020-10-02 ENCOUNTER — Ambulatory Visit (HOSPITAL_COMMUNITY): Payer: Medicare HMO | Attending: Cardiology

## 2020-10-02 ENCOUNTER — Other Ambulatory Visit: Payer: Self-pay

## 2020-10-02 DIAGNOSIS — R0789 Other chest pain: Secondary | ICD-10-CM | POA: Diagnosis not present

## 2020-10-02 LAB — ECHOCARDIOGRAM COMPLETE
Area-P 1/2: 3.72 cm2
S' Lateral: 3.7 cm

## 2020-10-08 DIAGNOSIS — X32XXXA Exposure to sunlight, initial encounter: Secondary | ICD-10-CM | POA: Diagnosis not present

## 2020-10-08 DIAGNOSIS — L821 Other seborrheic keratosis: Secondary | ICD-10-CM | POA: Diagnosis not present

## 2020-10-08 DIAGNOSIS — L570 Actinic keratosis: Secondary | ICD-10-CM | POA: Diagnosis not present

## 2020-10-08 DIAGNOSIS — L820 Inflamed seborrheic keratosis: Secondary | ICD-10-CM | POA: Diagnosis not present

## 2020-10-15 DIAGNOSIS — H524 Presbyopia: Secondary | ICD-10-CM | POA: Diagnosis not present

## 2020-11-18 ENCOUNTER — Other Ambulatory Visit: Payer: Self-pay

## 2020-11-18 ENCOUNTER — Other Ambulatory Visit: Payer: Medicare HMO | Admitting: *Deleted

## 2020-11-18 DIAGNOSIS — E7800 Pure hypercholesterolemia, unspecified: Secondary | ICD-10-CM

## 2020-11-18 LAB — LIPID PANEL
Chol/HDL Ratio: 3 ratio (ref 0.0–5.0)
Cholesterol, Total: 119 mg/dL (ref 100–199)
HDL: 40 mg/dL (ref >39)
LDL Chol Calc (NIH): 63 mg/dL (ref 0–99)
Triglycerides: 79 mg/dL (ref 0–149)
VLDL Cholesterol Cal: 16 mg/dL (ref 5–40)

## 2020-12-03 ENCOUNTER — Other Ambulatory Visit: Payer: Self-pay

## 2020-12-03 MED ORDER — ROSUVASTATIN CALCIUM 20 MG PO TABS: 20.0000 mg | ORAL_TABLET | Freq: Every day | ORAL | 2 refills | Status: DC

## 2021-03-04 DIAGNOSIS — K006 Disturbances in tooth eruption: Secondary | ICD-10-CM | POA: Diagnosis not present

## 2021-03-04 DIAGNOSIS — R69 Illness, unspecified: Secondary | ICD-10-CM | POA: Diagnosis not present

## 2021-03-17 DIAGNOSIS — Z23 Encounter for immunization: Secondary | ICD-10-CM | POA: Diagnosis not present

## 2021-03-17 DIAGNOSIS — Z1159 Encounter for screening for other viral diseases: Secondary | ICD-10-CM | POA: Diagnosis not present

## 2021-03-17 DIAGNOSIS — I10 Essential (primary) hypertension: Secondary | ICD-10-CM | POA: Diagnosis not present

## 2021-03-17 DIAGNOSIS — E7800 Pure hypercholesterolemia, unspecified: Secondary | ICD-10-CM | POA: Diagnosis not present

## 2021-03-17 DIAGNOSIS — M170 Bilateral primary osteoarthritis of knee: Secondary | ICD-10-CM | POA: Diagnosis not present

## 2021-04-08 DIAGNOSIS — M170 Bilateral primary osteoarthritis of knee: Secondary | ICD-10-CM | POA: Diagnosis not present

## 2021-04-08 DIAGNOSIS — M1712 Unilateral primary osteoarthritis, left knee: Secondary | ICD-10-CM | POA: Diagnosis not present

## 2021-04-22 DIAGNOSIS — M25562 Pain in left knee: Secondary | ICD-10-CM | POA: Diagnosis not present

## 2021-04-22 DIAGNOSIS — M25561 Pain in right knee: Secondary | ICD-10-CM | POA: Diagnosis not present

## 2021-05-28 DIAGNOSIS — M25562 Pain in left knee: Secondary | ICD-10-CM | POA: Diagnosis not present

## 2021-05-28 DIAGNOSIS — M25561 Pain in right knee: Secondary | ICD-10-CM | POA: Diagnosis not present

## 2021-07-29 ENCOUNTER — Other Ambulatory Visit: Payer: Self-pay | Admitting: *Deleted

## 2021-07-29 MED ORDER — ROSUVASTATIN CALCIUM 20 MG PO TABS: 20.0000 mg | ORAL_TABLET | Freq: Every day | ORAL | 0 refills | Status: DC

## 2021-10-12 ENCOUNTER — Other Ambulatory Visit: Payer: Self-pay | Admitting: *Deleted

## 2021-10-12 MED ORDER — ROSUVASTATIN CALCIUM 20 MG PO TABS: 20.0000 mg | ORAL_TABLET | Freq: Every day | ORAL | 0 refills | Status: DC

## 2021-11-03 ENCOUNTER — Other Ambulatory Visit: Payer: Self-pay | Admitting: *Deleted

## 2021-11-03 MED ORDER — ROSUVASTATIN CALCIUM 20 MG PO TABS: 20.0000 mg | ORAL_TABLET | Freq: Every day | ORAL | 0 refills | Status: DC

## 2022-01-05 ENCOUNTER — Other Ambulatory Visit: Payer: Self-pay | Admitting: Physician Assistant

## 2022-01-05 DIAGNOSIS — M21371 Foot drop, right foot: Secondary | ICD-10-CM

## 2022-02-18 ENCOUNTER — Encounter: Payer: Self-pay | Admitting: Neurology

## 2022-02-18 ENCOUNTER — Ambulatory Visit: Payer: Medicare HMO | Admitting: Neurology

## 2022-02-18 VITALS — BP 138/83 | HR 69 | Ht 71.0 in | Wt 213.6 lb

## 2022-02-18 DIAGNOSIS — M5417 Radiculopathy, lumbosacral region: Secondary | ICD-10-CM | POA: Diagnosis not present

## 2022-02-18 NOTE — Patient Instructions (Addendum)
Orders Placed This Encounter  Procedures   Ambulatory referral to Neurosurgery   Patient with an L5 right radic causing right foot drop and sensory changes in an L5 distribution. His whole right leg hurts (not classically radicular but does describe pain extending from the low back to the foot) I do NOT believe this is peroneal neuropath but feel this is L5 radic: he has weakness of foot and toe dorsiflexion, weakness of foot eversion AND inversion as well as leg flexion weakness and absent right Aj reflex with consistent findings on MRI showing severe right medial neuroforaminal narrowing impinging and cranially and dorsally displacing the exiting right L5 nerve roots.  - They would like referral to Dr. Trenton Gammon and I agree considering surgical options is valid and needs appointment with Dr. Trenton Gammon.      Most people have heard of sciatica and may have experienced it at some point. Sciatica refers to a sharp, shooting pain that runs from your lower back down the side or the back of your leg.  Sciatica is named after the sciatic nerve, which is the largest nerve in your body. It is about the diameter of your little finger and composed of five nerve roots that start in your lower spine. These nerve roots combine to form this large nerve that branches from your lower back through your hips and buttocks and down each leg.  Causes Despite the name, sciatica is not typically caused by a problem with the sciatic nerve. In most cases, it is caused by compression of one of the nerve roots that make up the sciatic nerve, usually the last lumbar nerve root ? L5 ? or the first sacral nerve root ? S1 ? as they exit the spine. The term "pinched nerve" is commonly used when describing the condition. The medical term for the condition is radiculopathy.  The most common cause of radiculopathy is a herniated lumbar disc. When disc material protrudes from the disc space into the spinal canal, it can compress a nerve  root. Radiculopathy can be caused by an accident, injury or lifting a large amount of weight improperly. However, most commonly people cannot link the onset of their symptoms to any event. They simply wake up one day to pain radiating down one leg.  Other causes of sciatica include lumbar stenosis, which is narrowing of the spinal canal leading to pinching of the nerve root, and spondylolisthesis, which is a slip forward of one vertebra on another.  Symptoms When a nerve root is compressed, people experience three general symptoms: pain, numbness and weakness. Not everyone will experience all three symptoms.  Pain is the most common symptom, and it is usually a sharp, shooting pain along the nerve. It can vary widely, from a mild ache to a sharp, burning sensation or excruciating pain. Sometimes it can feel like a jolt or electric shock. It can be worse when you cough or sneeze, and prolonged sitting can aggravate symptoms. Usually only one side of your body is affected.  If the last lumbar nerve root is being compressed ? L5 radiculopathy ? the pain generally runs down the outside of side of the leg. If the first sacral nerve root is being compressed ? S1 radiculopathy ? the pain normally radiates down the back of the leg.  Numbness also is a common symptom. This can show as decreased sensation or tingling in the same way as sciatic pain. L5 radiculopathy is usually associated with numbness down the side of the leg  and into the top of the foot. S1 radiculopathy typically results in numbness down the back of the leg into the outside or bottom of the foot.  Weakness is another symptom of nerve root compression. However, it is less common than pain and numbness. This displays as decreased function in the muscles supplied by the nerve root that is compressed. For L5 radiculopathy, this is usually weakness with bending the foot back toward your head. When severe, this can cause a "foot drop" with the foot  slapping the ground when you walk. For S1 radiculopathy, there can be weakness with flexing the foot forward, like pushing down on a gas pedal.  Treatments A range of treatments is available for radiculopathy, including: Activity modification An important first step is to modify your activities, such as avoiding bending, lifting and twisting activities. This minimizes further irritation of the nerve root and allows your body to heal. Medications Over-the-counter medications, such as acetaminophen and ibuprofen, can effectively reduce pain. Your health care provider also may prescribe other medications, such as oral steroids, to reduce inflammation or a muscle relaxer if you are experiencing spasms in the muscles in your back. Generally, sciatic pain and radiculopathy are not treated with opioid pain medications. Physical therapy and chiropractic therapy These therapies also can effectively treat lower back problems, including radiculopathy. Steroid injections Some people find relief from targeted steroid injections into the lumbar spine directly around the nerve root that is irritated and compressed. Corticosteroid injections reduce pain by suppressing inflammation around the irritated nerve. Surgery This is an excellent treatment for L5 or S1 radiculopathy when other conservative, nonsurgical treatments have not worked. There are generally two reasons for surgery. The first is significant weakness, such as a foot drop. In these cases, the sooner pressure is off the nerve, the more likely you can fully recover. The second more common reason to have surgery is if conservative treatments have failed. Usually, the most conservative treatments are recommended first and more invasive treatments are only pursued if necessary. Most cases of L5 or S1 radiculopathy will resolve within a few weeks or months.

## 2022-02-18 NOTE — Progress Notes (Signed)
[facility]    Provider:  Dr Jaynee Eagles Requesting Provider: Olen Cordial, PA Primary Care Provider:  Caren Macadam, MD  CC:  L5 radiculopathy  HPI:  [name] is a 71 y.o. male here as requested by Olen Cordial, PA for Foot drop.  He has a past medical history of hypertension, hypercholesterolemia, overweight, spinal stenosis, hearing loss/hearing aids, white coat syndrome, he has been to [facility] in November 2022 and December 2022 to Dr. Reynaldo Minium for left knee pain and steroid injections, right knee also had steroid injections but did not improve, in January 2023 saw Dr. Reynaldo Minium for meniscal tear and arthroscopic debridement.  I reviewed Dr. Roma Kayser and Hetty Blend Turmel's notes, patient presented with weakness and a deep ache in his right ankle and foot, ongoing since June 28, at that time he had a Doppler ultrasound for DVT which was negative and was prescribed naproxen, he asked for a referral to a neurologist or spine specialist for evaluation due to concern of a possibly pinched nerve affecting his leg, he has the worst pain he has been experiencing in his hip as well but notes he has never had pain in his back.  He has weakness in the right ankle and foot and is unable to walk on his heel without his foot drop, pre-CBC evaluated by physical therapy, constant dull ache in his right leg, worse with activity and standing, improved with rest.   He had the knee surgery in February, cortizone injections of the right foot. In May he saw Al[facility] again, he had pain in the knee and had medial bursitis and got a shot medially in the knee, in June he was playing golf, walking fine, walking a lot and doing lots of physical work, his right foot was swollen, then his right calf was pain and throbbing but no heat no swelling, they went to the ED and neg for DVT and sent to an orthopaedist in [place] and was in PT he went July 8th and noticed he had a drop foot (in restrospect possibly had it  at the end of June), PT diagnosed with drop foot, the knee specialist recommended an MRI lumbar spine, in the meantime they were working with [facility] for the knees, knee injections laterally around fib head AFTER the foot drop started. He was given neurontin and a steroid and he felt improvement in the right toe weakness also had numbness and tingling top of right foot and the bottom as well. As a result, MRI was done, he saw Dr. Nelva Bush who did not think injections were warranted due to no pain in the low back or radicular symptoms. But he does have aching pain down the entire leg. No other focal neurologic deficits, associated symptoms, inciting events or modifiable factors.    Reviewed notes, labs and imaging from outside physicians, which showed:  MRI was completed at [facility] completed there which shows a likely L5 radiculopathy which can cause his symptoms:   Small right foraminal/extraforaminal disc extrusion with mild cranial migration with conjoined right L5-S1 nerve root sleeve resulting in severe right medial neuroforaminal narrowing impinging and cranially and dorsally displacing the exiting right L5 nerve roots with moderate left neuroforaminal narrowing. Moderate to severe left and mild to moderate right L4-L5 neuroforaminal narrowing Moderate to severe left and mild right L3-L4 neuroforaminal narrowing with mild impingement and dorsal lateral displacement of the exiting left L3 nerve roots.  Moderate to severe left subarticular zone narrowing with impingement and mild  dorsal medial displacement of descending left L4 nerve roots.  Mild right subarticular zone narrowing without spinal canal stenosis. Moderate right and mild to moderate left L2-L3 neural foraminal narrowing.  Mild to moderate right and mild left subarticular zone narrowing contacting and minimally displacing descending right L3 nerve roots without spinal canal stenosis. Mild left L1-L2 neuroforaminal  narrowing. Mild dextroconvex lumbar scoliosis, apex centered at L3.  Mild left lateral listhesis of L2 on L3.  Mild multilevel retrolisthesis.  B12 791    Review of Systems: Patient complains of symptoms per HPI as well as the following symptoms foot weakness. Pertinent negatives and positives per HPI. All others negative.   Social History   Socioeconomic History   Marital status: Married    Spouse name: Not on file   Number of children: Not on file   Years of education: Not on file   Highest education level: Not on file  Occupational History   Not on file  Tobacco Use   Smoking status: Never   Smokeless tobacco: Never  Vaping Use   Vaping Use: Never used  Substance and Sexual Activity   Alcohol use: Yes    Comment: occasional   Drug use: Never   Sexual activity: Not on file  Other Topics Concern   Not on file  Social History Narrative   Caffeine occasional coffee   Education: masters.   Retired. From ministry.    Social Determinants of Health   Financial Resource Strain: Not on file  Food Insecurity: Not on file  Transportation Needs: Not on file  Physical Activity: Not on file  Stress: Not on file  Social Connections: Not on file  Intimate Partner Violence: Not on file    Family History  Problem Relation Age of Onset   CVA Brother     Past Medical History:  Diagnosis Date   COVID    DJD (degenerative joint disease) of knee    Dyspnea on exertion    Hearing loss    Hypercholesteremia    Hypertension    Overweight    Paresthesias    Personal history of colonic polyps    Primary osteoarthritis of both knees    Skin mole    Spinal stenosis    Stenosis of right subclavian artery [facility])     Patient Active Problem List   Diagnosis Date Noted   Lumbosacral radiculopathy at L5 02/18/2022    Past Surgical History:  Procedure Laterality Date   APPENDECTOMY     CARDIAC CATHETERIZATION     HERNIA REPAIR     KNEE SURGERY Right    SHOULDER SURGERY  Left     Current Outpatient Medications  Medication Sig Dispense Refill   aspirin EC 81 MG tablet Take 81 mg by mouth daily. Swallow whole.     gabapentin (NEURONTIN) 300 MG capsule Take 300 mg by mouth 3 (three) times daily.     hydrochlorothiazide (HYDRODIURIL) 12.5 MG tablet Take 12.5 mg by mouth daily.     losartan (COZAAR) 100 MG tablet Take 100 mg by mouth daily.     Multiple Vitamin (MULTIVITAMIN) tablet Take 1 tablet by mouth daily.     rosuvastatin (CRESTOR) 20 MG tablet Take 1 tablet (20 mg total) by mouth daily. Please schedule appointment for future refills. Thank you (Patient not taking: Reported on 02/18/2022) 15 tablet 0   No current facility-administered medications for this visit.    Allergies as of 02/18/2022   (No Known Allergies)    Vitals:  BP 138/83   Pulse 69   Ht '5\' 11"'$  (1.803 m)   Wt 213 lb 9.6 oz (96.9 kg)   BMI 29.79 kg/m  Last Weight:  Wt Readings from Last 1 Encounters:  02/18/22 213 lb 9.6 oz (96.9 kg)   Last Height:   Ht Readings from Last 1 Encounters:  02/18/22 '5\' 11"'$  (1.803 m)     Physical exam: Exam: Gen: NAD, conversant, well nourised,  well groomed                     CV: RRR, no MRG. No Carotid Bruits. No peripheral edema, warm, nontender Eyes: Conjunctivae clear without exudates or hemorrhage  Neuro: Detailed Neurologic Exam  Speech:    Speech is normal; fluent and spontaneous with normal comprehension.  Cognition:    The patient is oriented to person, place, and time;     recent and remote memory intact;     language fluent;     normal attention, concentration,     fund of knowledge Cranial Nerves:    The pupils are equal, round, and reactive to light. Attempted . Visual fields are full to finger confrontation. Extraocular movements are intact. Trigeminal sensation is intact and the muscles of mastication are normal. The face is symmetric. The palate elevates in the midline. Hearing intact. Voice is normal. Shoulder shrug is  normal. The tongue has normal motion without fasciculations.   Coordination:    Normal   Gait:     steppage.   Motor Observation:    No asymmetry, no atrophy, and no involuntary movements noted. Tone:    Normal muscle tone.    Posture:    Posture is normal. normal erect    Strength: right leg leg flexion weakness. 2+/5 dorsiflexion foot , 3/5 great toe. Weakness if right foot eversion and inversion. Otherwise strength is V/V in the upper and lower limbs.      Sensation: decreased in an L5 distribution.      Reflex Exam:  DTR's: right patellar 2+, left patellar 2+, right AJ absent, leftAJ trace to 1+ (had to use dendritic maneuver), upper     Toes:    The toes are downgoing bilaterally.   Clonus:    Clonus is absent.    Assessment/Plan:  Patient with an L5 right radic causing right foot drop and sensory changes in an L5 distribution. His whole right leg hurts (not classically radicular but does describe pain extending from the low back to the foot) I do NOT believe this is peroneal neuropath but feel this is L5 radic: he has weakness of foot and toe dorsiflexion, weakness of foot eversion AND inversion as well as leg flexion weakness and absent right Aj reflex with consistent findings on MRI showing severe right medial neuroforaminal narrowing impinging and cranially and dorsally displacing the exiting right L5 nerve roots.  - They would like referral to Dr. Trenton Gammon and I agree considering surgical options is valid and needs appointment with Dr. Trenton Gammon.   Orders Placed This Encounter  Procedures   Ambulatory referral to Neurosurgery   No orders of the defined types were placed in this encounter.   Cc: Olen Cordial, PA,  Caren Macadam, MD  Sarina Ill, MD  [facility] [address] [place], [place] [address]  Phone [phone] Fax [phone]  I spent over 60 minutes of face-to-face and non-face-to-face time with patient on the   1. Lumbosacral radiculopathy at L5    diagnosis.  This included previsit chart review, lab review, study review, order entry, electronic health record documentation, patient education on the different diagnostic and therapeutic options, counseling and coordination of care, risks and benefits of management, compliance, or risk factor reduction

## 2022-02-19 ENCOUNTER — Telehealth: Payer: Self-pay | Admitting: Neurology

## 2022-02-19 NOTE — Telephone Encounter (Signed)
Referral for neurosurgery sent to [facility]. Phone: [phone], Fax: [phone] Pt requested Dr. Annette Stable

## 2022-03-20 IMAGING — CT CT HEART MORP W/ CTA COR W/ SCORE W/ CA W/CM &/OR W/O CM
4 of 7 series · 8 of 20 positions shown, 9 images · IV contrast (APPLIED)
Comparison: None.
COMPARISON: None.

Addendum:
EXAM:
OVER-READ INTERPRETATION  CT CHEST

The following report is an over-read performed by radiologist Dr.
Rufino Penner [REDACTED] on 09/18/2020. This
over-read does not include interpretation of cardiac or coronary
anatomy or pathology. The coronary calcium score/coronary CTA
interpretation by the cardiologist is attached.
CLINICAL DATA: This is a 69 year old male with chest pain/Anginal
equivalent.
Cardiac/Coronary  CTA
TECHNIQUE: The patient was scanned on a Phillips Force scanner.

[Series 6: best diast 71 % · axial · 0.39mm/px · z∈[+1171,+1217]mm · 2 of 346 slices shown]
[im 116/346  vessel]
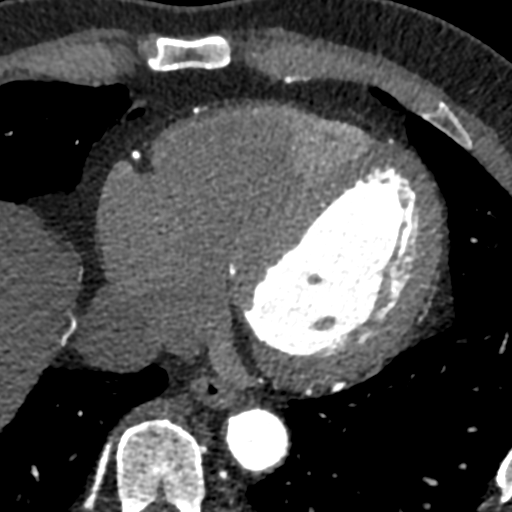
[im 231/346  vessel]
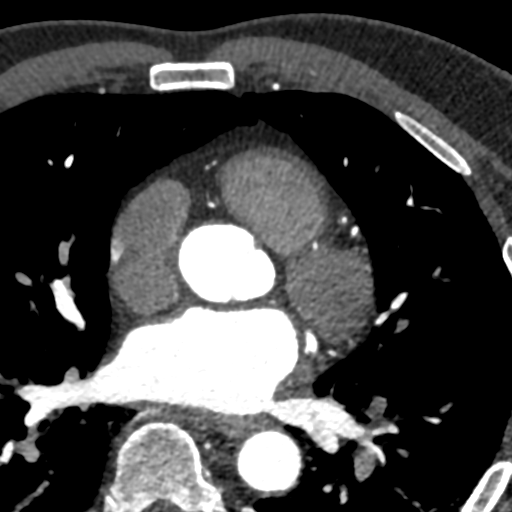

[Series 7: best syst · axial · 0.39mm/px · z∈[+1171,+1217]mm · 2 of 346 slices shown, 3 images]
[im 116/346  vessel]
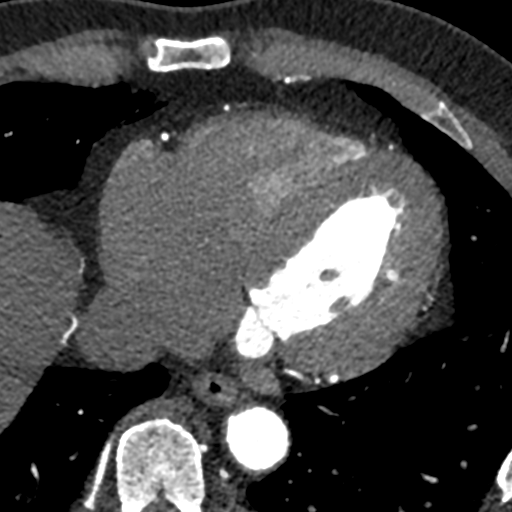
[im 116/346  lung]
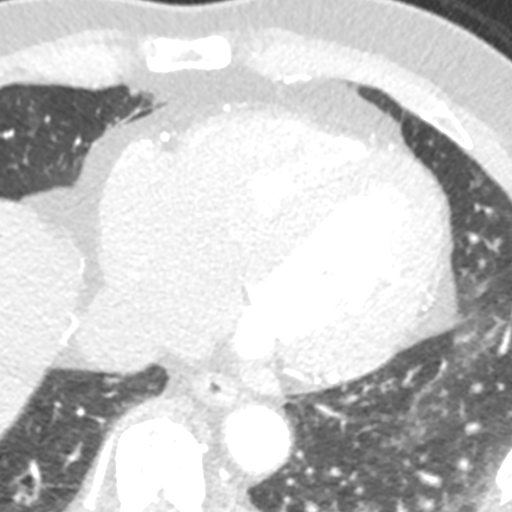
[im 231/346  vessel]
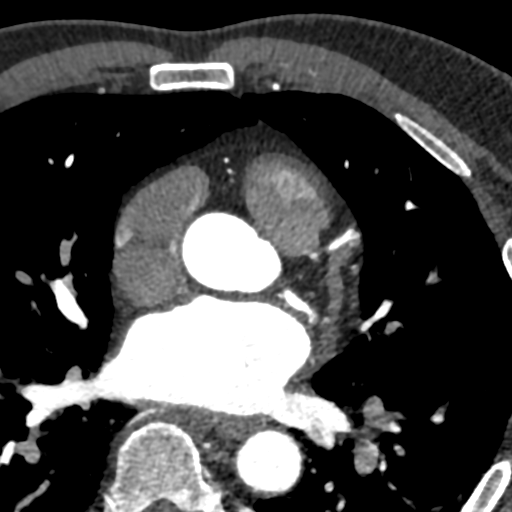

[Series 8: ts diast sharp 71 % · axial · 0.39mm/px · z∈[+1171,+1217]mm · 2 of 346 slices shown]
[im 116/346  lung]
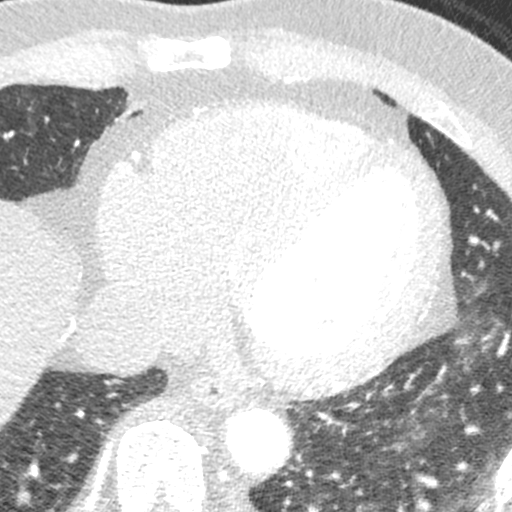
[im 231/346  lung]
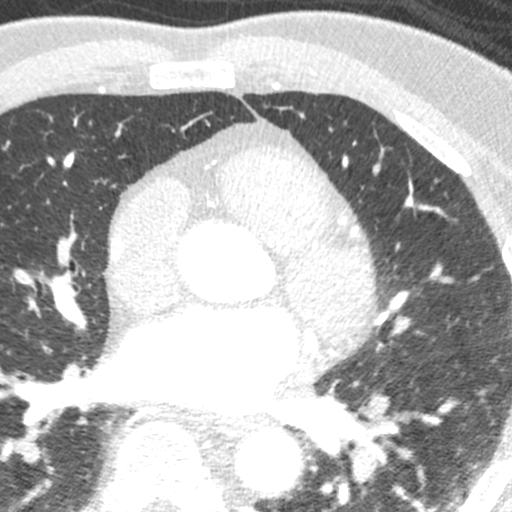

[Series 9: ts syst sharp · axial · 0.39mm/px · z∈[+1171,+1217]mm · 2 of 346 slices shown]
[im 116/346  lung]
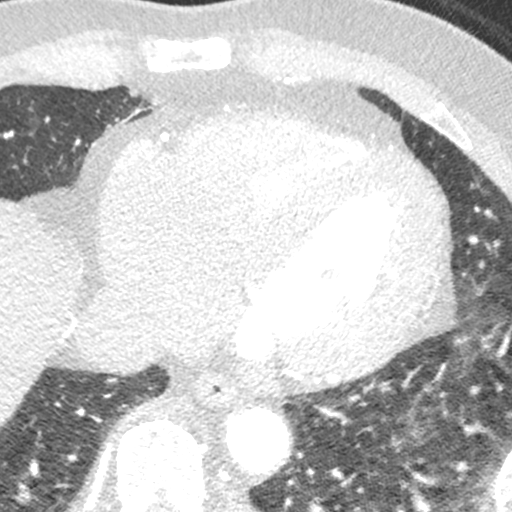
[im 231/346  lung]
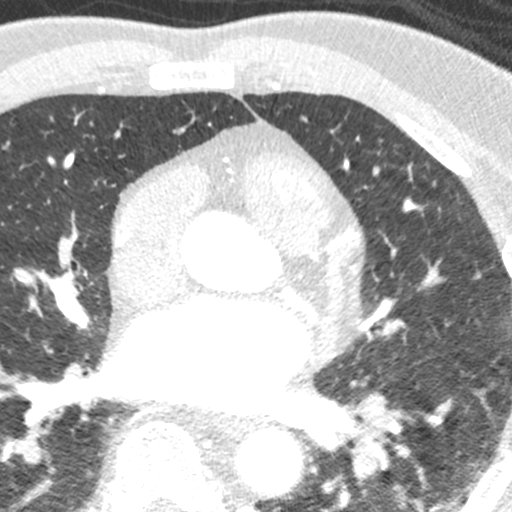

[8 of 20 positions shown; findings below may reference images not displayed]

FINDINGS: Aortic atherosclerosis. Within the visualized portions of the thorax
there are no suspicious appearing pulmonary nodules or masses, there
is no acute consolidative airspace disease, no pleural effusions, no
pneumothorax and no lymphadenopathy. Visualized portions of the
upper abdomen are unremarkable. There are no aggressive appearing
lytic or blastic lesions noted in the visualized portions of the
skeleton.
IMPRESSION: 1.  Aortic Atherosclerosis (8FY5Q-UX5.5).
FINDINGS: A 100 kV prospective scan was triggered in the descending thoracic
aorta at 111 HU's. Axial non-contrast 3 mm slices were carried out
through the heart. The data set was analyzed on a dedicated work
station and scored using the Agatson method. Gantry rotation speed
was 250 msecs and collimation was .6 mm. No beta blockade and 0.8 mg
of sl NTG was given. The 3D data set was reconstructed in 5%
intervals of the 67-82 % of the R-R cycle. Diastolic phases were
analyzed on a dedicated work station using MPR, MIP and VRT modes.
The patient received 80 cc of contrast.

Aorta:  Normal size (33.2 mm).  No calcifications.  No dissection.

Aortic Valve:  Trileaflet.  Mild Calcifications.

Coronary Arteries:  Normal coronary origin.  Co-dominance.

RCA is a co-dominant artery that gives rise to PDA and PLA. There is
minimal (<24%) calcified plaque the proximal RCA, closer to the
ostium of the RCA. The mid and distal RCA with no plaques.

Left main is a large artery that gives rise to LAD and LCX arteries.
There is minimal calcified plaque in the left main artery.

LAD is a large vessel. The proximal LAD with mild (25-49%) mixed
plaque. The mid and distal LAD with no plaque. D1 and D2 are small
vessels with no plaque. D3 is a medium caliber vessel with no
plaques.

LCX is a large co-dominant artery that gives rise to three obtuse
marginal branches. There is no plaque.

Other findings:

Normal pulmonary vein drainage into the left atrium.

Normal left atrial appendage without a thrombus.

Normal size of the pulmonary artery.
IMPRESSION: 1. Coronary calcium score of 170. This was 50 percentile for age and
sex matched control.

2. Normal coronary origin with Co-dominance.

3. Mild Coronary artery disease. CAD-RADS 2. Mild non-obstructive
CAD (25-49%). Consider non-atherosclerotic causes of chest pain.
Consider preventive therapy and risk factor modification.

*** End of Addendum ***
EXAM:
OVER-READ INTERPRETATION  CT CHEST

The following report is an over-read performed by radiologist Dr.
Rufino Penner [REDACTED] on 09/18/2020. This
over-read does not include interpretation of cardiac or coronary
anatomy or pathology. The coronary calcium score/coronary CTA
interpretation by the cardiologist is attached.
FINDINGS: Aortic atherosclerosis. Within the visualized portions of the thorax
there are no suspicious appearing pulmonary nodules or masses, there
is no acute consolidative airspace disease, no pleural effusions, no
pneumothorax and no lymphadenopathy. Visualized portions of the
upper abdomen are unremarkable. There are no aggressive appearing
lytic or blastic lesions noted in the visualized portions of the
skeleton.
IMPRESSION: 1.  Aortic Atherosclerosis (8FY5Q-UX5.5).

## 2022-06-17 DIAGNOSIS — M1711 Unilateral primary osteoarthritis, right knee: Secondary | ICD-10-CM | POA: Diagnosis not present

## 2022-06-24 DIAGNOSIS — Z6829 Body mass index (BMI) 29.0-29.9, adult: Secondary | ICD-10-CM | POA: Diagnosis not present

## 2022-06-24 DIAGNOSIS — M5416 Radiculopathy, lumbar region: Secondary | ICD-10-CM | POA: Diagnosis not present

## 2022-08-13 ENCOUNTER — Telehealth: Payer: Self-pay | Admitting: *Deleted

## 2022-08-13 NOTE — Telephone Encounter (Signed)
1st attempt to reach patient to schedule an in-office visit for pre-op clearance.

## 2022-08-13 NOTE — Telephone Encounter (Signed)
Primary Cardiologist:None  Chart reviewed as part of pre-operative protocol coverage. Because of [name]'s past medical history and time since last visit, he/she will require a follow-up visit in order to better assess preoperative cardiovascular risk.  Pre-op covering staff: - Please schedule appointment and call patient to inform them. - Please contact requesting surgeon's office via preferred method (i.e, phone, fax) to inform them of need for appointment prior to surgery.  There is no reason from a cardiac perspective that he cannot hold aspirin for 5-7 days if indicated, pending no s/s of ACS at time of office visit.   Emmaline Life, NP-C  08/13/2022, 11:14 AM [address], [address] Office [phone] Fax [phone]

## 2022-08-13 NOTE — Telephone Encounter (Signed)
   Pre-operative Risk Assessment    Patient Name: [name]  DOB: 1951/04/01 MRN: [id number]      Request for Surgical Clearance    Procedure:   RIGHT TOTAL KNEE ARTHROPLASTY  Date of Surgery:  Clearance 10/05/22                                 Surgeon:  DR. Gaynelle Arabian Surgeon's Group or Practice Name:  [facility] Phone number:  [phone] ATTN: [place] [facility] Fax number:  [phone]   Type of Clearance Requested:   - Medical ; ASA    Type of Anesthesia:   CHOICE   Additional requests/questions:    Jiles Prows   08/13/2022, 9:55 AM

## 2022-08-17 NOTE — Telephone Encounter (Signed)
2nd attempt to reach pt regarding surgical clearance and the need for a in office appointment.  Left message for pt to call back and schedule.

## 2022-08-18 NOTE — Telephone Encounter (Signed)
Patient was returning call. Please advise ?

## 2022-08-18 NOTE — Telephone Encounter (Signed)
I s/w the pt and he he has been scheduled to see Richardson Dopp, [facility] 08/24/22 @ 8:50. In office appt for pre op clearance. Pt thanked me for the help. I will update all parties involved.

## 2022-08-23 NOTE — Progress Notes (Unsigned)
Cardiology Office Note:    Date:  08/24/2022   ID:  [name], DOB Feb 04, 1951, MRN [id number]  PCP:  Caren Macadam, MD  [facility] Providers Cardiologist:  None    Referring MD: Caren Macadam, MD   Chief Complaint:  No chief complaint on file.    Patient Profile:  Non-obstructive CAD  2022 CT coronary morph w/ CTA Cor: Mild non-obstructive CAD (25-49%). Coronary calcium score of 170. This was 54 percentile for age and sex matched control.  Hypertension  Hyperlipidemia  Cardiac Studies & Procedures       ECHOCARDIOGRAM  ECHOCARDIOGRAM COMPLETE 10/02/2020  Narrative ECHOCARDIOGRAM REPORT    Patient Name:   [name] Date of Exam: 10/02/2020 Medical Rec #:  [id number]        Height:       71.0 in Accession #:    [id number]       Weight:       209.0 lb Date of Birth:  10/31/1950        BSA:          2.148 m Patient Age:    72 years         BP:           140/90 mmHg Patient Gender: M                HR:           58 bpm. Exam Location:  [facility]  Procedure: 2D Echo, 3D Echo, Cardiac Doppler, Color Doppler and Strain Analysis  Indications:    R07.89 Chest pain  History:        Patient has no prior history of Echocardiogram examinations. Risk Factors:Hypertension and Dyslipidemia. COVID-19.  Sonographer:    Jessee Avers, RDCS Referring Phys: [id number] [place]   1. Left ventricular ejection fraction, by estimation, is 50 to 55%. Left ventricular ejection fraction by 3D volume is 53 %. The left ventricle has low normal function. The left ventricle has no regional wall motion abnormalities. Left ventricular diastolic parameters were normal. The average left ventricular global longitudinal strain is -22.2 %. The global longitudinal strain is normal. 2. Right ventricular systolic function is normal. The right ventricular size is normal. Tricuspid regurgitation signal is inadequate for assessing PA pressure. 3. The  mitral valve is normal in structure. Trivial mitral valve regurgitation. No evidence of mitral stenosis. 4. The aortic valve is tricuspid. Aortic valve regurgitation is not visualized. No aortic stenosis is present. 5. The inferior vena cava is normal in size with greater than 50% respiratory variability, suggesting right atrial pressure of 3 mmHg.  Comparison(s): No prior Echocardiogram.  Conclusion(s)/Recommendation(s): Normal biventricular function without evidence of hemodynamically significant valvular heart disease.  FINDINGS Left Ventricle: Left ventricular ejection fraction, by estimation, is 50 to 55%. Left ventricular ejection fraction by 3D volume is 53 %. The left ventricle has low normal function. The left ventricle has no regional wall motion abnormalities. The average left ventricular global longitudinal strain is -22.2 %. The global longitudinal strain is normal. The left ventricular internal cavity size was normal in size. There is no left ventricular hypertrophy. Left ventricular diastolic parameters were normal.  Right Ventricle: The right ventricular size is normal. No increase in right ventricular wall thickness. Right ventricular systolic function is normal. Tricuspid regurgitation signal is inadequate for assessing PA pressure.  Left Atrium: Left atrial size was normal in size.  Right Atrium: Right atrial size was normal  in size.  Pericardium: There is no evidence of pericardial effusion. Presence of pericardial fat pad.  Mitral Valve: The mitral valve is normal in structure. Trivial mitral valve regurgitation. No evidence of mitral valve stenosis.  Tricuspid Valve: The tricuspid valve is normal in structure. Tricuspid valve regurgitation is trivial. No evidence of tricuspid stenosis.  Aortic Valve: The aortic valve is tricuspid. Aortic valve regurgitation is not visualized. No aortic stenosis is present.  Pulmonic Valve: The pulmonic valve was grossly normal.  Pulmonic valve regurgitation is trivial. No evidence of pulmonic stenosis.  Aorta: The aortic root, ascending aorta and aortic arch are all structurally normal, with no evidence of dilitation or obstruction.  Venous: The inferior vena cava is normal in size with greater than 50% respiratory variability, suggesting right atrial pressure of 3 mmHg.  IAS/Shunts: No atrial level shunt detected by color flow Doppler.   LEFT VENTRICLE PLAX 2D LVIDd:         5.20 cm         Diastology LVIDs:         3.70 cm         LV e' medial:    7.09 cm/s LV PW:         1.00 cm         LV E/e' medial:  10.5 LV IVS:        0.90 cm         LV e' lateral:   8.33 cm/s LVOT diam:     2.10 cm         LV E/e' lateral: 9.0 LV SV:         80 LV SV Index:   37              2D LVOT Area:     3.46 cm        Longitudinal Strain 2D Strain GLS  -21.9 % (A2C): 2D Strain GLS  -22.3 % (A3C): 2D Strain GLS  -22.4 % (A4C): 2D Strain GLS  -22.2 % Avg:  3D Volume EF LV 3D EF:    Left ventricular ejection fraction by 3D volume is 53 %.  3D Volume EF: 3D EF:        53 % LV EDV:       145 ml LV ESV:       69 ml LV SV:        77 ml  RIGHT VENTRICLE RV Basal diam:  3.40 cm RV S prime:     9.57 cm/s TAPSE (M-mode): 2.5 cm  LEFT ATRIUM             Index       RIGHT ATRIUM           Index LA diam:        4.20 cm 1.95 cm/m  RA Pressure: 3.00 mmHg LA Vol (A2C):   55.4 ml 25.79 ml/m RA Area:     16.60 cm LA Vol (A4C):   47.9 ml 22.30 ml/m RA Volume:   45.00 ml  20.95 ml/m LA Biplane Vol: 51.7 ml 24.06 ml/m AORTIC VALVE LVOT Vmax:   91.40 cm/s LVOT Vmean:  71.100 cm/s LVOT VTI:    0.230 m  AORTA Ao Root diam: 3.70 cm Ao Asc diam:  3.50 cm  MITRAL VALVE               TRICUSPID VALVE Estimated RAP:  3.00 mmHg  MV E velocity: 74.60 cm/s  SHUNTS MV A  velocity: 72.40 cm/s  Systemic VTI:  0.23 m MV E/A ratio:  1.03        Systemic Diam: 2.10 cm  Buford Dresser MD Electronically signed by  Buford Dresser MD Signature Date/Time: 10/02/2020/11:19:08 AM    Final     CT SCANS  CT CORONARY MORPH W/CTA COR W/SCORE 09/18/2020  Addendum 09/18/2020  6:37 PM ADDENDUM REPORT: 09/18/2020 18:35  CLINICAL DATA:  This is a 72 year old male with chest pain/Anginal equivalent.  EXAM: Cardiac/Coronary  CTA  TECHNIQUE: The patient was scanned on a [employer].  FINDINGS: A 100 kV prospective scan was triggered in the descending thoracic aorta at 111 HU's. Axial non-contrast 3 mm slices were carried out through the heart. The data set was analyzed on a dedicated work station and scored using the [place]. Gantry rotation speed was 250 msecs and collimation was .6 mm. No beta blockade and 0.8 mg of sl NTG was given. The 3D data set was reconstructed in 5% intervals of the 67-82 % of the R-R cycle. Diastolic phases were analyzed on a dedicated work station using MPR, MIP and VRT modes. The patient received 80 cc of contrast.  Aorta:  Normal size (33.2 mm).  No calcifications.  No dissection.  Aortic Valve:  Trileaflet.  Mild Calcifications.  Coronary Arteries:  Normal coronary origin.  Co-dominance.  RCA is a co-dominant artery that gives rise to PDA and PLA. There is minimal (<24%) calcified plaque the proximal RCA, closer to the ostium of the RCA. The mid and distal RCA with no plaques.  Left main is a large artery that gives rise to LAD and LCX arteries. There is minimal calcified plaque in the left main artery.  LAD is a large vessel. The proximal LAD with mild (25-49%) mixed plaque. The mid and distal LAD with no plaque. D1 and D2 are small vessels with no plaque. D3 is a medium caliber vessel with no plaques.  LCX is a large co-dominant artery that gives rise to three obtuse marginal branches. There is no plaque.  Other findings:  Normal pulmonary vein drainage into the left atrium.  Normal left atrial appendage without a  thrombus.  Normal size of the pulmonary artery.  IMPRESSION: 1. Coronary calcium score of 170. This was 25 percentile for age and sex matched control.  2. Normal coronary origin with Co-dominance.  3. Mild Coronary artery disease. CAD-RADS 2. Mild non-obstructive CAD (25-49%). Consider non-atherosclerotic causes of chest pain. Consider preventive therapy and risk factor modification.   Electronically Signed By: Berniece Salines DO On: 09/18/2020 18:35  Narrative EXAM: OVER-READ INTERPRETATION  CT CHEST  The following report is an over-read performed by radiologist Dr. Vinnie Langton of [facility], [place] on 09/18/2020. This over-read does not include interpretation of cardiac or coronary anatomy or pathology. The coronary calcium score/coronary CTA interpretation by the cardiologist is attached.  COMPARISON:  None.  FINDINGS: Aortic atherosclerosis. Within the visualized portions of the thorax there are no suspicious appearing pulmonary nodules or masses, there is no acute consolidative airspace disease, no pleural effusions, no pneumothorax and no lymphadenopathy. Visualized portions of the upper abdomen are unremarkable. There are no aggressive appearing lytic or blastic lesions noted in the visualized portions of the skeleton.  IMPRESSION: 1.  Aortic Atherosclerosis (ICD10-I70.0).  Electronically Signed: By: Vinnie Langton M.D. On: 09/18/2020 10:07           History of Present Illness:   FAUSTINO FERM is a 72  y.o. male with the above problem list.  He was last seen by our office in March of 2022 for symptoms of increased dyspnea on exertion/reduced exercise capacity. At that time, patient noted that he had progressed from being able to ride a bike 65 miles in a day in 2020 to shortness of breath when walking up an incline. Cardiac CT revealed mild non-obstructive CAD and patient was transitioned to stronger statin therapy. TTE showed normal LVEF and no  significant valvular dysfunction.   Patient has a pending right knee total arthroplasty on 10/05/22 with Dr. Maureen Ralphs and presents for surgical clearance. Since his last visit, patient has undergone meniscus repair in his right knee as well as a back procedure. He denies chest pain, shortness of breath, palpitations, or other symptoms with exertion. Overall he feels like his exertional tolerance has remained stable since being seen in 2022. He continues to cycle regularly and has no difficulty completing work in and around the house. Regarding medications, patient was previously taking HCTZ 12.5 mg, Losartan 100mg , and Crestor 20mg  but says that he stopped taking all three, along with ASA. He denies side effects, says that he just got tired of taking medications and admits to being a little suspicious of medications in general. He hasn't noticed symptoms of increased BP though admits that he does not regularly check either.      Past Medical History:  Diagnosis Date   COVID    DJD (degenerative joint disease) of knee    Dyspnea on exertion    Hearing loss    Hypercholesteremia    Hypertension    Overweight    Paresthesias    Personal history of colonic polyps    Primary osteoarthritis of both knees    Skin mole    Spinal stenosis    Stenosis of right subclavian artery (HCC)    Current Medications: Current Meds  Medication Sig   [DISCONTINUED] hydrochlorothiazide (MICROZIDE) 12.5 MG capsule Take 1 capsule (12.5 mg total) by mouth daily.   [DISCONTINUED] losartan (COZAAR) 100 MG tablet Take 1 tablet (100 mg total) by mouth daily.   [DISCONTINUED] rosuvastatin (CRESTOR) 20 MG tablet Take 1 tablet (20 mg total) by mouth daily.    Allergies:   Patient has no known allergies.   Social History   Occupational History   Not on file  Tobacco Use   Smoking status: Never   Smokeless tobacco: Never  Vaping Use   Vaping Use: Never used  Substance and Sexual Activity   Alcohol use: Yes     Comment: occasional   Drug use: Never   Sexual activity: Not on file    Family Hx: The patient's family history includes CVA in his brother.  Review of Systems  Constitutional: Negative for malaise/fatigue, weight gain and weight loss.  Cardiovascular:  Negative for chest pain, claudication, dyspnea on exertion, irregular heartbeat, leg swelling, near-syncope, orthopnea, palpitations, paroxysmal nocturnal dyspnea and syncope.  Musculoskeletal:  Positive for arthritis, joint pain and muscle weakness.  Gastrointestinal:  Negative for bloating.  All other systems reviewed and are negative.    EKGs/Labs/Other Test Reviewed:    EKG:  EKG is ordered today.  The ekg ordered today demonstrates NSR with some lead artifact in leads II, III.   Recent Labs: No results found for requested labs within last 365 days.   Recent Lipid Panel No results for input(s): "CHOL", "TRIG", "HDL", "VLDL", "LDLCALC", "LDLDIRECT" in the last 8760 hours.    Risk Assessment/Calculations/Metrics:  HYPERTENSION CONTROL Vitals:   08/24/22 0915 08/24/22 1001  BP: (!) 178/98 (!) 172/98    The patient's blood pressure is elevated above target today.  In order to address the patient's elevated BP: A current anti-hypertensive medication was adjusted today.; Follow up with general cardiology has been recommended.       Physical Exam:    VS:  BP (!) 172/98 (Patient Position: Supine, Cuff Size: Normal)   Pulse 66   Ht 5\' 11"  (1.803 m)   Wt 204 lb 6.4 oz (92.7 kg)   SpO2 98%   BMI 28.51 kg/m     Wt Readings from Last 3 Encounters:  08/24/22 204 lb 6.4 oz (92.7 kg)  02/18/22 213 lb 9.6 oz (96.9 kg)  08/29/20 209 lb (94.8 kg)    Vitals reviewed.  Constitutional:      Appearance: Healthy appearance.  Eyes:     Pupils: Pupils are equal, round, and reactive to light.  Pulmonary:     Effort: Pulmonary effort is normal.  Chest:     Chest wall: Not tender to palpatation.  Cardiovascular:      PMI at left midclavicular line. Normal rate. Regular rhythm. Normal S1. Normal S2.      Murmurs: There is no murmur.  Edema:    Peripheral edema absent.  Abdominal:     General: Bowel sounds are normal.     Palpations: Abdomen is soft.  Musculoskeletal: Normal range of motion.     Cervical back: Normal range of motion. Skin:    General: Skin is warm and dry.  Neurological:     Mental Status: Alert and oriented to person, place and time.         ASSESSMENT & PLAN:   Hypertension Patient's BP elevated during OV today. He reports self-discontinuing HCTZ and Losartan several months ago. Given plan for total knee arthroplasty, reviewed the importance of better BP control. Patient is agreeable to restarting HCTZ 12.5mg  once daily and Losartan 100mg  once daily. He has been instructed to keep a daily BP log and will follow up with our office in 4 weeks.   Hyperlipidemia Patient with mild non-obstructive CAD per 2022 CT coronary morph w/ CTA Cor. He was transitioned from Simvastatin to Rosuvastatin following this study but discontinued taking in the last few months. LDL at last check with PCP on 02/19/22 was 166. Patient agreeable to restarting Crestor 20mg . Will plan to recheck lipids at 4 week follow up. Low fat diet encouraged.  Nonobstructive atherosclerosis of coronary artery Patient without chest pain, dyspnea, or decreasing exertional tolerance. ECG today with NSR, no ischemic changes. As above, resume HCTZ, Losartan, Crestor.  Pre-operative cardiovascular examination Patient with planned total right knee arthroplasty with Dr. Wynelle Link on 4/30. According to the Revised Cardiac Risk Index (RCRI), his Perioperative Risk of Major Cardiac Event is (%): 0.4 His Functional Capacity in METs is: 8.91 according to the [facility] Activity Status Index (DASI). Patient with elevated BP during OV today after stopping anti-hypertensive regimen. He will resume HCTZ and Losartan today with 4 week follow up  scheduled. Assuming improved BP at this follow up, based on ACC/AHA guidelines, patient would be at acceptable risk for the planned procedure without further cardiovascular testing.              Dispo:  No follow-ups on file.   Medication Adjustments/Labs and Tests Ordered: Current medicines are reviewed at length with the patient today.  Concerns regarding medicines are outlined above.  Tests Ordered: Orders  Placed This Encounter  Procedures   Basic metabolic panel   Lipid panel   EKG 12-Lead   Medication Changes: Meds ordered this encounter  Medications   DISCONTD: hydrochlorothiazide (MICROZIDE) 12.5 MG capsule    Sig: Take 1 capsule (12.5 mg total) by mouth daily.    Dispense:  90 capsule    Refill:  3   DISCONTD: rosuvastatin (CRESTOR) 20 MG tablet    Sig: Take 1 tablet (20 mg total) by mouth daily.    Dispense:  90 tablet    Refill:  3   DISCONTD: losartan (COZAAR) 100 MG tablet    Sig: Take 1 tablet (100 mg total) by mouth daily.    Dispense:  90 tablet    Refill:  3   hydrochlorothiazide (MICROZIDE) 12.5 MG capsule    Sig: Take 1 capsule (12.5 mg total) by mouth daily.    Dispense:  90 capsule    Refill:  3   losartan (COZAAR) 100 MG tablet    Sig: Take 1 tablet (100 mg total) by mouth daily.    Dispense:  90 tablet    Refill:  3   rosuvastatin (CRESTOR) 20 MG tablet    Sig: Take 1 tablet (20 mg total) by mouth daily.    Dispense:  90 tablet    Refill:  3   Signed, Lily Kocher, PA-C  08/24/2022 10:16 AM    [address], [place], [place]  [address] Phone: [phone]; Fax: [phone]

## 2022-08-24 ENCOUNTER — Ambulatory Visit: Payer: HMO | Attending: Physician Assistant | Admitting: Cardiology

## 2022-08-24 ENCOUNTER — Encounter: Payer: Self-pay | Admitting: Physician Assistant

## 2022-08-24 VITALS — BP 172/98 | HR 66 | Ht 71.0 in | Wt 204.4 lb

## 2022-08-24 DIAGNOSIS — Z01818 Encounter for other preprocedural examination: Secondary | ICD-10-CM

## 2022-08-24 DIAGNOSIS — I10 Essential (primary) hypertension: Secondary | ICD-10-CM | POA: Diagnosis not present

## 2022-08-24 DIAGNOSIS — Z01810 Encounter for preprocedural cardiovascular examination: Secondary | ICD-10-CM | POA: Insufficient documentation

## 2022-08-24 DIAGNOSIS — E782 Mixed hyperlipidemia: Secondary | ICD-10-CM

## 2022-08-24 DIAGNOSIS — I2510 Atherosclerotic heart disease of native coronary artery without angina pectoris: Secondary | ICD-10-CM | POA: Insufficient documentation

## 2022-08-24 DIAGNOSIS — E785 Hyperlipidemia, unspecified: Secondary | ICD-10-CM | POA: Insufficient documentation

## 2022-08-24 MED ORDER — ROSUVASTATIN CALCIUM 20 MG PO TABS: 20.0000 mg | ORAL_TABLET | Freq: Every day | ORAL | 3 refills | Status: DC

## 2022-08-24 MED ORDER — LOSARTAN POTASSIUM 100 MG PO TABS: 100.0000 mg | ORAL_TABLET | Freq: Every day | ORAL | 3 refills | Status: DC

## 2022-08-24 MED ORDER — HYDROCHLOROTHIAZIDE 12.5 MG PO CAPS: 12.5000 mg | ORAL_CAPSULE | Freq: Every day | ORAL | 3 refills | Status: DC

## 2022-08-24 NOTE — Assessment & Plan Note (Signed)
Patient's BP elevated during OV today. He reports self-discontinuing HCTZ and Losartan several months ago. Given plan for total knee arthroplasty, reviewed the importance of better BP control. Patient is agreeable to restarting HCTZ 12.5mg  once daily and Losartan 100mg  once daily. He has been instructed to keep a daily BP log and will follow up with our office in 4 weeks.

## 2022-08-24 NOTE — Assessment & Plan Note (Addendum)
Patient without chest pain, dyspnea, or decreasing exertional tolerance. ECG today with NSR, no ischemic changes. As above, resume HCTZ, Losartan, Crestor. Given minimal non-obstructive CAD, no strong benefit to ASA, patient can continue not taking at this time.

## 2022-08-24 NOTE — Assessment & Plan Note (Addendum)
Patient with planned total right knee arthroplasty with Dr. Wynelle Link on 4/30. According to the Revised Cardiac Risk Index (RCRI), his Perioperative Risk of Major Cardiac Event is (%): 0.4 His Functional Capacity in METs is: 8.91 according to the [facility] Activity Status Index (DASI). Patient with elevated BP during OV today after stopping anti-hypertensive regimen. He will resume HCTZ and Losartan today with 4 week follow up scheduled. Assuming improved BP at this follow up, based on ACC/AHA guidelines, patient would be at acceptable risk for the planned procedure without further cardiovascular testing.

## 2022-08-24 NOTE — Patient Instructions (Addendum)
Medication Instructions:  Your physician has recommended you make the following change in your medication:   START Hydrochlorothiazide 12.5 taking 1 daily   START Crestor 20 mg taking 1 daily  START Losartan 100 mg taking 1 daily   *If you need a refill on your cardiac medications before your next appointment, please call your pharmacy*   Lab Work: None today, when you come back in 4 weeks, come fasting.  If you have labs (blood work) drawn today and your tests are completely normal, you will receive your results only by: [facility] (if you have MyChart) OR A paper copy in the mail If you have any lab test that is abnormal or we need to change your treatment, we will call you to review the results.   Testing/Procedures: None ordered   Follow-Up: At [facility], you and your health needs are our priority.  As part of our continuing mission to provide you with exceptional heart care, we have created designated Provider Care Teams.  These Care Teams include your primary Cardiologist (physician) and Advanced Practice Providers (APPs -  Physician Assistants and Nurse Practitioners) who all work together to provide you with the care you need, when you need it.  We recommend signing up for the patient portal called "MyChart".  Sign up information is provided on this After Visit Summary.  MyChart is used to connect with patients for Virtual Visits (Telemedicine).  Patients are able to view lab/test results, encounter notes, upcoming appointments, etc.  Non-urgent messages can be sent to your provider as well.   To learn more about what you can do with MyChart, go to [email or web address].    Your next appointment:   4 week(s)  Provider:   Richardson Dopp, PA-C         Other Instructions

## 2022-08-24 NOTE — Assessment & Plan Note (Signed)
Patient with mild non-obstructive CAD per 2022 CT coronary morph w/ CTA Cor. He was transitioned from Simvastatin to Rosuvastatin following this study but discontinued taking in the last few months. LDL at last check with PCP on 02/19/22 was 166. Patient agreeable to restarting Crestor 20mg . Will plan to recheck lipids at 4 week follow up. Low fat diet encouraged.

## 2022-08-25 DIAGNOSIS — Z0000 Encounter for general adult medical examination without abnormal findings: Secondary | ICD-10-CM | POA: Diagnosis not present

## 2022-08-25 DIAGNOSIS — M170 Bilateral primary osteoarthritis of knee: Secondary | ICD-10-CM | POA: Diagnosis not present

## 2022-08-25 DIAGNOSIS — N529 Male erectile dysfunction, unspecified: Secondary | ICD-10-CM | POA: Diagnosis not present

## 2022-08-25 DIAGNOSIS — Z01818 Encounter for other preprocedural examination: Secondary | ICD-10-CM | POA: Diagnosis not present

## 2022-08-25 DIAGNOSIS — E7800 Pure hypercholesterolemia, unspecified: Secondary | ICD-10-CM | POA: Diagnosis not present

## 2022-08-25 DIAGNOSIS — I2510 Atherosclerotic heart disease of native coronary artery without angina pectoris: Secondary | ICD-10-CM | POA: Diagnosis not present

## 2022-08-25 DIAGNOSIS — L570 Actinic keratosis: Secondary | ICD-10-CM | POA: Diagnosis not present

## 2022-08-25 DIAGNOSIS — I10 Essential (primary) hypertension: Secondary | ICD-10-CM | POA: Diagnosis not present

## 2022-09-03 DIAGNOSIS — R29700 NIHSS score 0: Secondary | ICD-10-CM | POA: Diagnosis not present

## 2022-09-03 DIAGNOSIS — G8929 Other chronic pain: Secondary | ICD-10-CM | POA: Diagnosis not present

## 2022-09-03 DIAGNOSIS — M170 Bilateral primary osteoarthritis of knee: Secondary | ICD-10-CM | POA: Diagnosis not present

## 2022-09-03 DIAGNOSIS — R519 Headache, unspecified: Secondary | ICD-10-CM | POA: Diagnosis not present

## 2022-09-03 DIAGNOSIS — H539 Unspecified visual disturbance: Secondary | ICD-10-CM | POA: Diagnosis not present

## 2022-09-03 DIAGNOSIS — H538 Other visual disturbances: Secondary | ICD-10-CM | POA: Diagnosis not present

## 2022-09-03 DIAGNOSIS — I2510 Atherosclerotic heart disease of native coronary artery without angina pectoris: Secondary | ICD-10-CM | POA: Diagnosis not present

## 2022-09-03 DIAGNOSIS — Z8616 Personal history of COVID-19: Secondary | ICD-10-CM | POA: Diagnosis not present

## 2022-09-03 DIAGNOSIS — M5417 Radiculopathy, lumbosacral region: Secondary | ICD-10-CM | POA: Diagnosis not present

## 2022-09-03 DIAGNOSIS — I10 Essential (primary) hypertension: Secondary | ICD-10-CM | POA: Diagnosis not present

## 2022-09-03 DIAGNOSIS — E782 Mixed hyperlipidemia: Secondary | ICD-10-CM | POA: Diagnosis not present

## 2022-09-03 DIAGNOSIS — I63531 Cerebral infarction due to unspecified occlusion or stenosis of right posterior cerebral artery: Secondary | ICD-10-CM | POA: Diagnosis not present

## 2022-09-03 DIAGNOSIS — I6623 Occlusion and stenosis of bilateral posterior cerebral arteries: Secondary | ICD-10-CM | POA: Diagnosis not present

## 2022-09-03 DIAGNOSIS — R42 Dizziness and giddiness: Secondary | ICD-10-CM | POA: Diagnosis not present

## 2022-09-03 DIAGNOSIS — H5340 Unspecified visual field defects: Secondary | ICD-10-CM | POA: Diagnosis not present

## 2022-09-03 DIAGNOSIS — Z7982 Long term (current) use of aspirin: Secondary | ICD-10-CM | POA: Diagnosis not present

## 2022-09-03 DIAGNOSIS — I63331 Cerebral infarction due to thrombosis of right posterior cerebral artery: Secondary | ICD-10-CM | POA: Diagnosis not present

## 2022-09-03 DIAGNOSIS — M21371 Foot drop, right foot: Secondary | ICD-10-CM | POA: Diagnosis not present

## 2022-09-03 DIAGNOSIS — I639 Cerebral infarction, unspecified: Secondary | ICD-10-CM | POA: Diagnosis not present

## 2022-09-10 ENCOUNTER — Ambulatory Visit
Admission: RE | Admit: 2022-09-10 | Discharge: 2022-09-10 | Disposition: A | Discharge status: Home / Self Care | Payer: HMO | Source: Ambulatory Visit | Attending: Family Medicine | Admitting: Family Medicine

## 2022-09-10 ENCOUNTER — Other Ambulatory Visit: Payer: Self-pay | Admitting: Family Medicine

## 2022-09-10 DIAGNOSIS — I669 Occlusion and stenosis of unspecified cerebral artery: Secondary | ICD-10-CM | POA: Diagnosis not present

## 2022-09-10 DIAGNOSIS — I10 Essential (primary) hypertension: Secondary | ICD-10-CM | POA: Diagnosis not present

## 2022-09-10 DIAGNOSIS — E7800 Pure hypercholesterolemia, unspecified: Secondary | ICD-10-CM | POA: Diagnosis not present

## 2022-09-10 DIAGNOSIS — Z01818 Encounter for other preprocedural examination: Secondary | ICD-10-CM

## 2022-09-10 DIAGNOSIS — Z8673 Personal history of transient ischemic attack (TIA), and cerebral infarction without residual deficits: Secondary | ICD-10-CM | POA: Diagnosis not present

## 2022-09-10 DIAGNOSIS — Z09 Encounter for follow-up examination after completed treatment for conditions other than malignant neoplasm: Secondary | ICD-10-CM | POA: Diagnosis not present

## 2022-09-15 ENCOUNTER — Telehealth (HOSPITAL_COMMUNITY): Payer: Self-pay

## 2022-09-15 NOTE — Telephone Encounter (Signed)
Called to schedule consultation, no answer, left vm. AB

## 2022-09-16 ENCOUNTER — Telehealth (HOSPITAL_COMMUNITY): Payer: Self-pay

## 2022-09-16 NOTE — Telephone Encounter (Signed)
Spoke to pt and found out that he had imaging done at [facility]. [facility] in [place]. I have sent an email to medical records requesting reports and imaging from pt's stay in March. I informed the pt that I would give him a call when I receive the reports to schedule. He agreed with this plan. AB

## 2022-09-17 ENCOUNTER — Other Ambulatory Visit (HOSPITAL_COMMUNITY): Payer: Self-pay

## 2022-09-17 MED ORDER — ROSUVASTATIN CALCIUM 40 MG PO TABS
40.0000 mg | ORAL_TABLET | Freq: Every day | ORAL | 1 refills | Status: AC
  Filled 2022-09-17: qty 90, 90d supply, fill #0

## 2022-09-18 ENCOUNTER — Emergency Department (HOSPITAL_COMMUNITY): Payer: HMO

## 2022-09-18 ENCOUNTER — Other Ambulatory Visit: Payer: Self-pay

## 2022-09-18 ENCOUNTER — Emergency Department (HOSPITAL_COMMUNITY)
Admission: EM | Admit: 2022-09-18 | Discharge: 2022-09-18 | Disposition: A | Discharge status: Home / Self Care | Payer: HMO | Attending: Emergency Medicine | Admitting: Emergency Medicine

## 2022-09-18 DIAGNOSIS — Z79899 Other long term (current) drug therapy: Secondary | ICD-10-CM | POA: Diagnosis not present

## 2022-09-18 DIAGNOSIS — Z7982 Long term (current) use of aspirin: Secondary | ICD-10-CM | POA: Diagnosis not present

## 2022-09-18 DIAGNOSIS — Z7902 Long term (current) use of antithrombotics/antiplatelets: Secondary | ICD-10-CM | POA: Insufficient documentation

## 2022-09-18 DIAGNOSIS — I6621 Occlusion and stenosis of right posterior cerebral artery: Secondary | ICD-10-CM | POA: Diagnosis not present

## 2022-09-18 DIAGNOSIS — Z8673 Personal history of transient ischemic attack (TIA), and cerebral infarction without residual deficits: Secondary | ICD-10-CM | POA: Insufficient documentation

## 2022-09-18 DIAGNOSIS — I10 Essential (primary) hypertension: Secondary | ICD-10-CM | POA: Diagnosis not present

## 2022-09-18 DIAGNOSIS — H547 Unspecified visual loss: Secondary | ICD-10-CM | POA: Diagnosis present

## 2022-09-18 DIAGNOSIS — G459 Transient cerebral ischemic attack, unspecified: Secondary | ICD-10-CM | POA: Diagnosis not present

## 2022-09-18 LAB — CBC
HCT: 40.7 % (ref 39.0–52.0)
Hemoglobin: 14.7 g/dL (ref 13.0–17.0)
MCH: 32 pg (ref 26.0–34.0)
MCHC: 36.1 g/dL — ABNORMAL HIGH (ref 30.0–36.0)
MCV: 88.5 fL (ref 80.0–100.0)
Platelets: 183 10*3/uL (ref 150–400)
RBC: 4.6 MIL/uL (ref 4.22–5.81)
RDW: 12.4 % (ref 11.5–15.5)
WBC: 6 10*3/uL (ref 4.0–10.5)
nRBC: 0 % (ref 0.0–0.2)

## 2022-09-18 LAB — COMPREHENSIVE METABOLIC PANEL
ALT: 34 U/L (ref 0–44)
AST: 33 U/L (ref 15–41)
Albumin: 4.3 g/dL (ref 3.5–5.0)
Alkaline Phosphatase: 68 U/L (ref 38–126)
Anion gap: 11 (ref 5–15)
BUN: 18 mg/dL (ref 8–23)
CO2: 26 mmol/L (ref 22–32)
Calcium: 9.3 mg/dL (ref 8.9–10.3)
Chloride: 102 mmol/L (ref 98–111)
Creatinine, Ser: 1.13 mg/dL (ref 0.61–1.24)
GFR, Estimated: >60 mL/min (ref >60)
Glucose, Bld: 100 mg/dL — ABNORMAL HIGH (ref 70–99)
Potassium: 3.5 mmol/L (ref 3.5–5.1)
Sodium: 139 mmol/L (ref 135–145)
Total Bilirubin: 0.6 mg/dL (ref 0.3–1.2)
Total Protein: 6.7 g/dL (ref 6.5–8.1)

## 2022-09-18 LAB — I-STAT CHEM 8, ED
BUN: 21 mg/dL (ref 8–23)
Calcium, Ion: 1.15 mmol/L (ref 1.15–1.40)
Chloride: 103 mmol/L (ref 98–111)
Creatinine, Ser: 1.1 mg/dL (ref 0.61–1.24)
Glucose, Bld: 100 mg/dL — ABNORMAL HIGH (ref 70–99)
HCT: 40 % (ref 39.0–52.0)
Hemoglobin: 13.6 g/dL (ref 13.0–17.0)
Potassium: 3.5 mmol/L (ref 3.5–5.1)
Sodium: 142 mmol/L (ref 135–145)
TCO2: 30 mmol/L (ref 22–32)

## 2022-09-18 LAB — DIFFERENTIAL
Abs Immature Granulocytes: 0.01 10*3/uL (ref 0.00–0.07)
Basophils Absolute: 0.1 10*3/uL (ref 0.0–0.1)
Basophils Relative: 1 %
Eosinophils Absolute: 0.1 10*3/uL (ref 0.0–0.5)
Eosinophils Relative: 2 %
Immature Granulocytes: 0 %
Lymphocytes Relative: 29 %
Lymphs Abs: 1.7 10*3/uL (ref 0.7–4.0)
Monocytes Absolute: 0.5 10*3/uL (ref 0.1–1.0)
Monocytes Relative: 8 %
Neutro Abs: 3.6 10*3/uL (ref 1.7–7.7)
Neutrophils Relative %: 60 %

## 2022-09-18 LAB — PROTIME-INR
INR: 1.1 (ref 0.8–1.2)
Prothrombin Time: 13.6 seconds (ref 11.4–15.2)

## 2022-09-18 LAB — ETHANOL: Alcohol, Ethyl (B): <10 mg/dL (ref <10)

## 2022-09-18 LAB — APTT: aPTT: 30 seconds (ref 24–36)

## 2022-09-18 MED ORDER — IOHEXOL 350 MG/ML SOLN
75.0000 mL | Freq: Once | INTRAVENOUS | Status: AC | PRN
  Administered 2022-09-18: 75 mL via INTRAVENOUS

## 2022-09-18 NOTE — Discharge Instructions (Addendum)
As we discussed, you have a TIA as your MRI did not show a stroke.  You do have a PCA stenosis and I agree with following up with Dr. Corliss Skains   I put in another referral to neurology  Please continue aspirin and Plavix  Return to ER if you have worsening blurry vision or loss of vision or trouble speaking or weakness or numbness

## 2022-09-18 NOTE — ED Provider Notes (Signed)
[place] EMERGENCY DEPARTMENT AT [facility] Provider Note   CSN: [id number] Arrival date & time: 09/18/22  1440     History  Chief Complaint  Patient presents with   Loss of Vision    [name] is a 72 y.o. male history of hypertension, recent stroke here presenting with loss of vision.  Patient states that he went to [facility] about 2 weeks ago and had left hemianopsia and went to the hospital was diagnosed with stroke.  He had a CTA that showed right PCA stenosis and was admitted for stroke workup.  Patient has been on aspirin and Plavix since then.  He states that he has not been feeling well for the last 2 weeks.  He states that he was mowing the lawn today and had sudden onset of left hemianopsia around 2:30 PM.  He sat down and it resolved.  He had another episode and that prompted him to come to the ER.  He states that he does not have any blurry vision anymore.  He is also able to see out of the left side of his visual field.  Denies any trouble speaking or weakness or numbness.  The history is provided by the patient.       Home Medications Prior to Admission medications   Medication Sig Start Date End Date Taking? Authorizing Provider  hydrochlorothiazide (MICROZIDE) 12.5 MG capsule Take 1 capsule (12.5 mg total) by mouth daily. 08/24/22   Perlie Gold, PA-C  losartan (COZAAR) 100 MG tablet Take 1 tablet (100 mg total) by mouth daily. 08/24/22   Perlie Gold, PA-C  Multiple Vitamin (MULTIVITAMIN) tablet Take 1 tablet by mouth daily.    [provider]  rosuvastatin (CRESTOR) 20 MG tablet Take 1 tablet (20 mg total) by mouth daily. 08/24/22   Perlie Gold, PA-C  rosuvastatin (CRESTOR) 40 MG tablet Take 1 tablet (40 mg total) by mouth daily. 09/17/22         Allergies    Patient has no known allergies.    Review of Systems   Review of Systems  Eyes:  Positive for visual disturbance.  All other systems reviewed and are  negative.   Physical Exam Updated Vital Signs BP (!) 155/101 (BP Location: Left Arm)   Pulse 74   Temp 98.5 F (36.9 C) (Oral)   Resp 13   SpO2 95%  Physical Exam Vitals and nursing note reviewed.  Constitutional:      Appearance: Normal appearance.  HENT:     Head: Normocephalic.     Nose: Nose normal.     Mouth/Throat:     Mouth: Mucous membranes are moist.  Eyes:     Extraocular Movements: Extraocular movements intact.     Pupils: Pupils are equal, round, and reactive to light.     Comments: No obvious visual field cut.  Extraocular movements are intact  Cardiovascular:     Rate and Rhythm: Normal rate and regular rhythm.     Pulses: Normal pulses.     Heart sounds: Normal heart sounds.  Pulmonary:     Effort: Pulmonary effort is normal.     Breath sounds: Normal breath sounds.  Abdominal:     General: Abdomen is flat.     Palpations: Abdomen is soft.  Musculoskeletal:        General: Normal range of motion.     Cervical back: Normal range of motion and neck supple.  Skin:    General: Skin is warm.  Capillary Refill: Capillary refill takes less than 2 seconds.  Neurological:     General: No focal deficit present.     Mental Status: He is alert and oriented to person, place, and time.     Comments: Cranial nerves II to XII intact.  Normal finger-nose bilaterally.  Normal strength and sensation bilaterally.  Psychiatric:        Mood and Affect: Mood normal.        Behavior: Behavior normal.     ED Results / Procedures / Treatments   Labs (all labs ordered are listed, but only abnormal results are displayed) Labs Reviewed  CBC - Abnormal; Notable for the following components:      Result Value   MCHC 36.1 (*)    All other components within normal limits  I-STAT CHEM 8, ED - Abnormal; Notable for the following components:   Glucose, Bld 100 (*)    All other components within normal limits  DIFFERENTIAL  PROTIME-INR  APTT  COMPREHENSIVE METABOLIC PANEL   ETHANOL    EKG None  Radiology No results found.  Procedures Procedures    Medications Ordered in ED Medications - No data to display  ED Course/ Medical Decision Making/ A&P                             Medical Decision Making MISTER KRAHENBUHL is a 72 y.o. male here presenting with left visual field cut.  The symptoms have resolved.  He is Zenaida Niece negative and does not qualify for code stroke right now.  I reviewed his records from [place].  He has severe right PCA stenosis I am concerned that he may have worsening stenosis or occlusion.  Will repeat a CTA head and neck and also get MRI brain.  I discussed case with Dr. Selina Cooley from neurology who agrees with holding off on code stroke   7:37 PM CTA showed right PCA stenosis.  MRI brain showed no stroke.  Labs unremarkable.  I discussed with Dr. Selina Cooley about the results.  She states that patient is on maximal medical therapy already and there will be no changes currently.  Patient has seen Dr. Lucia Gaskins in the past so we will refer back to Dr. Lucia Gaskins from neurology   Problems Addressed: TIA (transient ischemic attack): acute illness or injury  Amount and/or Complexity of Data Reviewed Labs: ordered. Decision-making details documented in ED Course. Radiology: ordered and independent interpretation performed. Decision-making details documented in ED Course.  Risk Prescription drug management.   Final Clinical Impression(s) / ED Diagnoses Final diagnoses:  None    Rx / DC Orders ED Discharge Orders     None         Charlynne Pander, MD 09/18/22 [phone]

## 2022-09-18 NOTE — ED Triage Notes (Signed)
Pt with hx of stroke two weeks ago (seen at a facility in [place]) here stating while he was mowing the lawn one hour ago became acutely fatigued all over and lost peripheral vision in L eye. Vision has returned now and has no focal neurologic deficits. Adds that yesterday he just felt "off," and continues to feel that way despite no focal deficits at this time. Compliant with Plavix and ASA since stroke.

## 2022-09-20 ENCOUNTER — Other Ambulatory Visit (HOSPITAL_COMMUNITY): Payer: Self-pay | Admitting: Interventional Radiology

## 2022-09-20 ENCOUNTER — Other Ambulatory Visit (HOSPITAL_COMMUNITY): Payer: Self-pay

## 2022-09-20 DIAGNOSIS — I771 Stricture of artery: Secondary | ICD-10-CM

## 2022-09-21 ENCOUNTER — Ambulatory Visit: Payer: HMO | Admitting: Physician Assistant

## 2022-09-21 ENCOUNTER — Ambulatory Visit (HOSPITAL_COMMUNITY)
Admission: RE | Admit: 2022-09-21 | Discharge: 2022-09-21 | Disposition: A | Discharge status: Home / Self Care | Payer: HMO | Source: Ambulatory Visit | Attending: Interventional Radiology | Admitting: Interventional Radiology

## 2022-09-21 ENCOUNTER — Other Ambulatory Visit (HOSPITAL_COMMUNITY): Payer: Self-pay | Admitting: Interventional Radiology

## 2022-09-21 ENCOUNTER — Other Ambulatory Visit (HOSPITAL_COMMUNITY): Payer: Self-pay

## 2022-09-21 DIAGNOSIS — I6621 Occlusion and stenosis of right posterior cerebral artery: Secondary | ICD-10-CM | POA: Diagnosis not present

## 2022-09-21 DIAGNOSIS — Z7982 Long term (current) use of aspirin: Secondary | ICD-10-CM | POA: Diagnosis not present

## 2022-09-21 DIAGNOSIS — I771 Stricture of artery: Secondary | ICD-10-CM

## 2022-09-21 DIAGNOSIS — Z7902 Long term (current) use of antithrombotics/antiplatelets: Secondary | ICD-10-CM | POA: Diagnosis not present

## 2022-09-22 HISTORY — PX: IR RADIOLOGIST EVAL & MGMT: IMG5224

## 2022-09-23 ENCOUNTER — Other Ambulatory Visit: Payer: Self-pay | Admitting: Radiology

## 2022-09-23 DIAGNOSIS — I771 Stricture of artery: Secondary | ICD-10-CM

## 2022-09-23 LAB — PLATELET INHIBITION P2Y12

## 2022-09-23 NOTE — H&P (Signed)
Chief Complaint: Left sided transient hemianopia. Patient presents for cerebral angiogram    Supervising Physician: Julieanne Cotton  Patient Status: [facility] - Out-pt  History of Present Illness: [name] is a 72 y.o. male outpatient. History of hypertension, hyperlipidemia, L5 radiculopathy. Recently diagnosed with right PCA stenosis at OSH when he presented with left sided hemianopia. Presented to the emergency department [facility] on September 18, 2022 with an onset of left sided hemianopia. Patient described the episode as self resolving with rest. And presented to the emergency room when a second episode occurred later in that evening. The second episode again, self resolved. CT [place] head, neck, width, and without contrast from September 18, 2022 reads. Fear right sided P2 PCA stenosis. Patient and his wife were seen in the Neuro interventional radiology clinic by Doctor Dr. Julieanne Cotton on September 22, 2022. There are a detailed discussion regarding the patient's condition as well as treatment options were discussed. Following that discussion, the Patient and his wife have elected to proceed with a diagnostic cerebral angiogram. The patient presents today for a cerebral diagnostic angiogram with conscious sedation.  Currently without any significant complaints. Patient alert and laying in bed,calm. Denies any fevers, headache, chest pain, SOB, cough, abdominal pain, nausea, vomiting or bleeding. Return precautions and treatment recommendations and follow-up discussed with the patient  who is agreeable with the plan.    Past Medical History:  Diagnosis Date   COVID    DJD (degenerative joint disease) of knee    Dyspnea on exertion    Hearing loss    Hypercholesteremia    Hypertension    Overweight    Paresthesias    Personal history of colonic polyps    Primary osteoarthritis of both knees    Skin mole    Spinal stenosis    Stenosis of right subclavian artery     Past  Surgical History:  Procedure Laterality Date   APPENDECTOMY     CARDIAC CATHETERIZATION     HERNIA REPAIR     IR RADIOLOGIST EVAL & MGMT  09/22/2022   KNEE SURGERY Right    SHOULDER SURGERY Left     Allergies: Patient has no known allergies.  Medications: Prior to Admission medications   Medication Sig Start Date End Date Taking? Authorizing Provider  hydrochlorothiazide (MICROZIDE) 12.5 MG capsule Take 1 capsule (12.5 mg total) by mouth daily. 08/24/22   Perlie Gold, PA-C  losartan (COZAAR) 100 MG tablet Take 1 tablet (100 mg total) by mouth daily. 08/24/22   Perlie Gold, PA-C  Multiple Vitamin (MULTIVITAMIN) tablet Take 1 tablet by mouth daily.    [provider]  rosuvastatin (CRESTOR) 20 MG tablet Take 1 tablet (20 mg total) by mouth daily. 08/24/22   Perlie Gold, PA-C  rosuvastatin (CRESTOR) 40 MG tablet Take 1 tablet (40 mg total) by mouth daily. 09/17/22        Family History  Problem Relation Age of Onset   CVA Brother     Social History   Socioeconomic History   Marital status: Married    Spouse name: Not on file   Number of children: Not on file   Years of education: Not on file   Highest education level: Not on file  Occupational History   Not on file  Tobacco Use   Smoking status: Never   Smokeless tobacco: Never  Vaping Use   Vaping Use: Never used  Substance and Sexual Activity   Alcohol use: Yes    Comment:  occasional   Drug use: Never   Sexual activity: Not on file  Other Topics Concern   Not on file  Social History Narrative   Caffeine occasional coffee   Education: masters.   Retired. From ministry.    Social Determinants of Health   Financial Resource Strain: Not on file  Food Insecurity: Not on file  Transportation Needs: Not on file  Physical Activity: Not on file  Stress: Not on file  Social Connections: Not on file     Review of Systems: A 12 point ROS discussed and pertinent positives are indicated in the HPI  above.  All other systems are negative.  Review of Systems  Constitutional:  Negative for fever.  HENT:  Negative for congestion.   Respiratory:  Negative for cough and shortness of breath.   Cardiovascular:  Negative for chest pain.  Gastrointestinal:  Negative for abdominal pain.  Neurological:  Negative for headaches.  Psychiatric/Behavioral:  Negative for behavioral problems and confusion.     Vital Signs: BP 123/81   Pulse 65   Temp 98.1 F (36.7 C) (Oral)   Resp 18   Ht 5\' 10"  (1.778 m)   Wt 198 lb (89.8 kg)   SpO2 95%   BMI 28.41 kg/m     Physical Exam Vitals and nursing note reviewed.  Constitutional:      Appearance: Normal appearance. He is well-developed.  HENT:     Head: Normocephalic.  Cardiovascular:     Rate and Rhythm: Normal rate and regular rhythm.     Heart sounds: Normal heart sounds.  Pulmonary:     Effort: Pulmonary effort is normal.     Breath sounds: Normal breath sounds.  Musculoskeletal:        General: Normal range of motion.     Cervical back: Normal range of motion.  Skin:    General: Skin is warm and dry.  Neurological:     General: No focal deficit present.     Mental Status: He is alert and oriented to person, place, and time.  Psychiatric:        Mood and Affect: Mood normal.        Behavior: Behavior normal.        Thought Content: Thought content normal.        Judgment: Judgment normal.     Imaging: IR Radiologist Eval & Mgmt  Result Date: 09/22/2022 EXAM: NEW PATIENT OFFICE VISIT CHIEF COMPLAINT: Intermittent loss of vision in the left eye with increased frequency. Current Pain Level: 1-10 HISTORY OF PRESENT ILLNESS: 72 year old right-handed gentleman who has been referred for evaluation of intermittent loss of vision in the left eye with increasing frequency. The patient is accompanied by his spouse. History obtained from the patient's spouse and from patient's electronic medical records. Neuroimaging studies were also  evaluated. The patient reports his initial episode of transient loss of vision lasting a few seconds in [place] approximately 2-1/2 weeks ago. He then had a second episode again lasting a few seconds less than a minute, and was associated with lightheadedness. He also experienced some generalized weakness sitting down. He denied any associated symptoms of speech difficulties, double vision, or limb paresthesias or weakness or incoordination or a preceding or a subsequent headache. The patient underwent evaluation in the emergency room at [facility] in [place]. He underwent a CT of the brain and CT angiogram which revealed stenosis of the right posterior cerebral artery. He was placed on aspirin and Plavix  and then discharged. On returning home, he had another episode recently while mowing his lawn. There was sudden loss of vision in his left visual field associated with lightheadedness which prompted him to lie down. This cleared in less than a minute. He then subsequently had another episode which prompted him to come to the hospital. An MRI of the brain revealed no evidence of acute ischemia. His CT angiogram, however, did reveal a high-grade stenosis of the right posterior cerebral artery P2 segment. Since that time, the patient reports having experienced multiple similar episodes lasting a few seconds without associated diplopia, or complete loss of vision. Reports no associated nausea or vomiting or vertiginous or incoordination symptoms. He feels just generally weak before recovering with the symptoms. Patient states he has been taking aspirin and Plavix diligently. He denies any episodes of loss of awareness, or seizure-like activity. He denies any symptoms of palpitations, chest pains, or of palpitations. Denies recent wheezing, coughing or sputum production. Denies any symptoms of difficulty eating or drinking. Denies any abdominal pains, constipation, diarrhea or of melena. No episodes  of dysuria, hematuria, or polyuria. No recent chills, fever or rigors. . * : Marital status: * : Married * : * : Spouse name: * : Not on file . * : Number of children: * : Not on file . * : Years of education: * : Not on file . * : Highest education level: * : Not on file Occupational History . * : Not on file Tobacco Use . * : Smoking status: * : Never . * : Smokeless tobacco: * : Never Vaping Use . * : Vaping Use: * : Never used Substance and Sexual Activity . * : Alcohol use: * : Yes * : * : Comment: occasional . * : Drug use: * : Never . * : Sexual activity: * : Not on file Other Topics * : Concern . * : Not on file Social History Narrative * : Caffeine occasional coffee * : Education: masters. * : Retired. From ministry. Food Insecurity: Not on file Transportation Needs: Not on file Physical Activity: Not on file Stress: Not on file Social Connections: Not on file Intimate Partner Violence: Not on file Family History Problem * : Relation * : Age of Onset . * : CVA * : Brother * : Past Medical History: Diagnosis * : Date . * : COVID * : . * : DJD (degenerative joint disease) of knee * : . * : Dyspnea on exertion * : . * : Hearing loss * : . * : Hypercholesteremia * : . * : Hypertension * : . * : Overweight * : . * : Paresthesias * : . * : Personal history of colonic polyps * : . * : Primary osteoarthritis of both knees * : . * : Skin mole * : . * : Spinal stenosis * : . * : Stenosis of right subclavian artery (HCC) * : Patient Active Problem List * : Diagnosis * : Date Noted . * : Lumbosacral radiculopathy at L5 * : 02/18/2022 Past Surgical History: Procedure * : Laterality * : Date . * : APPENDECTOMY * : * : . * : CARDIAC CATHETERIZATION * : * : . * : HERNIA REPAIR * : * : . * : KNEE SURGERY * : Right * : . * : SHOULDER SURGERY * : Left * : Current Outpatient Medications Medication * : Sig * : Dispense * : Refill . * :  aspirin EC 81 MG tablet * : Take 81 mg by mouth daily. Swallow whole. * : * : . * :  gabapentin (NEURONTIN) 300 MG capsule * : Take 300 mg by mouth 3 (three) times daily. * : * : . * : hydrochlorothiazide (HYDRODIURIL) 12.5 MG tablet * : Take 12.5 mg by mouth daily. * : * : . * : losartan (COZAAR) 100 MG tablet * : Take 100 mg by mouth daily. * : * : . * : Multiple Vitamin (MULTIVITAMIN) tablet * : Take 1 tablet by mouth daily. * : * : . * : rosuvastatin (CRESTOR) 20 MG tablet * : Take 1 tablet (20 mg total) by mouth daily. Please schedule appointment for future refills. Thank you (Patient not taking: Reported on 02/18/2022) Home Medications Prior to Admission medications Medication * : Sig * : Start Date * : End Date * : Taking? * : Authorizing Provider hydrochlorothiazide (MICROZIDE) 12.5 MG capsule * : Take 1 capsule (12.5 mg total) by mouth daily. * : 08/24/22 * : * : * : Perlie Gold, PA-C losartan (COZAAR) 100 MG tablet * : Take 1 tablet (100 mg total) by mouth daily. * : 08/24/22 * : * : * Perlie Gold, PA-C Multiple Vitamin (MULTIVITAMIN) tablet * : Take 1 tablet by mouth daily. * : * : * : * : [provider] rosuvastatin (CRESTOR) 20 MG tablet * : Take 1 tablet (20 mg total) by mouth daily. * : 08/24/22 * : * : * : Perlie Gold, PA-C rosuvastatin (CRESTOR) 40 MG tablet * : Take 1 tablet (40 mg total) by mouth daily. * : 09/17/22 * : * : * : Allergies: Patient has no known allergies. REVIEW OF SYSTEMS: Negative unless as mentioned above. PHYSICAL EXAMINATION: Appears in no acute distress. Normal eye contact. Awake, alert and oriented. Speech and comprehension intact. No gross abnormal neurological findings evident. Apparently no evidence of visual field deficit with the patient being able to identify fingers in his left visual field. Station and gait normal. ASSESSMENT AND PLAN: Patient and spouse informed symptoms most likely related to the high-grade stenosis seen in the right posterior cerebral artery petrous segment. The symptoms probably representing crescendo TIAs despite  patient taking his aspirin and Plavix. The reasons for obtaining a diagnostic catheter arteriogram is to accurately assess the degree of the stenosis of the PCA, to guide further management considerations such as endovascular revascularization. The diagnostic catheter arteriogram would be on an outpatient basis with conscious sedation. The procedure would be either via trans radial or transfemoral route. The patient would spend 3-4 hours thereafter in short-stay prior to being discharged. Will also obtain a P2Y12 in order to ascertain if the patient is a responder to Plavix. In the meantime, patient advised to continue taking his present medication and to maintain adequate hydration. Both the patient and the spouse leave with good understanding and agreement with the above management plan. Electronically Signed   By: Julieanne Cotton M.D.   On: 09/22/2022 12:29   MR BRAIN WO CONTRAST  Result Date: 09/18/2022 CLINICAL DATA:  Transient ischemic attack (TIA) EXAM: MRI HEAD WITHOUT CONTRAST TECHNIQUE: Multiplanar, multiecho pulse sequences of the brain and surrounding structures were obtained without intravenous contrast. COMPARISON:  Same day CTA. FINDINGS: Brain: No acute infarction, hemorrhage, hydrocephalus, extra-axial collection or mass lesion. Vascular: Major arterial flow voids are maintained at the skull base. Skull and upper cervical spine: Normal marrow signal. Sinuses/Orbits: Mild paranasal sinus  mucosal thickening. No acute orbital findings. Other: No sizable mastoid effusions. IMPRESSION: No evidence of acute intracranial abnormality. Electronically Signed   By: Feliberto Harts M.D.   On: 09/18/2022 18:33   CT ANGIO HEAD NECK W WO CM  Result Date: 09/18/2022 CLINICAL DATA:  Neuro deficit, acute, stroke suspected EXAM: CT ANGIOGRAPHY HEAD AND NECK WITH AND WITHOUT CONTRAST TECHNIQUE: Multidetector CT imaging of the head and neck was performed using the standard protocol during bolus  administration of intravenous contrast. Multiplanar CT image reconstructions and MIPs were obtained to evaluate the vascular anatomy. Carotid stenosis measurements (when applicable) are obtained utilizing NASCET criteria, using the distal internal carotid diameter as the denominator. RADIATION DOSE REDUCTION: This exam was performed according to the departmental dose-optimization program which includes automated exposure control, adjustment of the mA and/or kV according to patient size and/or use of iterative reconstruction technique. CONTRAST:  75mL OMNIPAQUE IOHEXOL 350 MG/ML SOLN COMPARISON:  None Available. FINDINGS: CT HEAD FINDINGS Brain: No evidence of acute large vascular territory infarction, hemorrhage, hydrocephalus, extra-axial collection or mass lesion/mass effect. Vascular: See below. Skull: No acute fracture. Sinuses/Orbits: Mild paranasal sinus mucosal thickening. Review of the MIP images confirms the above findings CTA NECK FINDINGS Aortic arch: Great vessel origins are patent without significant stenosis. Right carotid system: No evidence of dissection, stenosis (50% or greater), or occlusion. Left carotid system: No evidence of dissection, stenosis (50% or greater), or occlusion. Vertebral arteries: Left dominant. No evidence of dissection, stenosis (50% or greater), or occlusion. Skeleton: No acute abnormality on limited assessment. Other neck: No acute abnormality on limited assessment. Upper chest: Visualized lung apices are clear. Review of the MIP images confirms the above findings CTA HEAD FINDINGS Anterior circulation: Bilateral intracranial ICAs, MCAs and ACAs are patent without proximal hemodynamically significant stenosis. Posterior circulation: Bilateral intradural vertebral arteries and basilar artery are patent without significant stenosis. Incidental small/short-segment fenestration of the basilar artery, anatomic variant. Severe stenosis of the right proximal P2 PCA. Left fetal type  PCA and small vertebrobasilar system, anatomic variant Venous sinuses: As permitted by contrast timing, patent. Review of the MIP images confirms the above findings IMPRESSION: 1. No evidence of acute large vascular territory infarct or acute hemorrhage. 2. No large vessel occlusion. 3. Severe right P2 PCA stenosis. Electronically Signed   By: Feliberto Harts M.D.   On: 09/18/2022 17:23   DG Chest 2 View  Result Date: 09/13/2022 CLINICAL DATA:  Preop evaluation EXAM: CHEST - 2 VIEW COMPARISON:  08/14/2020 FINDINGS: The heart size and mediastinal contours are within normal limits. Both lungs are clear. The visualized skeletal structures are unremarkable. IMPRESSION: No active cardiopulmonary disease. Electronically Signed   By: Ernie Avena M.D.   On: 09/13/2022 08:24    Labs:  CBC: Recent Labs    09/18/22 1503 09/18/22 1513 09/24/22 0718  WBC 6.0  --  4.3  HGB 14.7 13.6 14.3  HCT 40.7 40.0 40.4  PLT 183  --  153    COAGS: Recent Labs    09/18/22 1503 09/24/22 0718  INR 1.1 1.0  APTT 30  --     BMP: Recent Labs    09/18/22 1503 09/18/22 1513 09/24/22 0718  NA 139 142 142  K 3.5 3.5 3.5  CL 102 103 106  CO2 26  --  26  GLUCOSE 100* 100* 110*  BUN 18 21 16   CALCIUM 9.3  --  9.0  CREATININE 1.13 1.10 1.07  GFRNONAA >60  --  >60  LIVER FUNCTION TESTS: Recent Labs    09/18/22 1503  BILITOT 0.6  AST 33  ALT 34  ALKPHOS 68  PROT 6.7  ALBUMIN 4.3    Assessment and Plan:  72 year old male outpatient. History of hypertension, hyperlipidemia, L5 radiculopathy. Recently diagnosed with right PCA stenosis at OSH when he presented with left sided hemianopia. Presented to the emergency department [facility] on September 18, 2022 with an onset of left sided hemianopia. Patient described the episode as self resolving with rest. And presented to the emergency room when a second episode occurred later in that evening. The second episode again, self resolved. CT [place] head,  neck, width, and without contrast from September 18, 2022 reads. Fear right sided P2 PCA stenosis. Patient and his wife were seen in the Neuro interventional radiology clinic by Doctor Dr. Julieanne Cotton on September 22, 2022. There are a detailed discussion regarding the patient's condition as well as treatment options were discussed. Following that discussion, the Patient and his wife have elected to proceed with a diagnostic cerebral angiogram. The patient presents today for a cerebral diagnostic angiogram with conscious sedation.  The patient is taking Plavix and aspirin. P2Y12 from 4.16.24 is 73.  all other labs, and medication's are within acceptable parameters. NKDA. Patient has been NPO since midnight.  Risks and benefits of cerebral angiogram were discussed with the patient including, but not limited to bleeding, infection, vascular injury or contrast induced renal failure.  This interventional procedure involves the use of X-rays and because of the nature of the planned procedure, it is possible that we will have prolonged use of X-ray fluoroscopy.  Potential radiation risks to you include (but are not limited to) the following: - A slightly elevated risk for cancer  several years later in life. This risk is typically less than 0.5% percent. This risk is low in comparison to the normal incidence of human cancer, which is 33% for women and 50% for men according to the [employer] Society. - Radiation induced injury can include skin redness, resembling a rash, tissue breakdown / ulcers and hair loss (which can be temporary or permanent).   The likelihood of either of these occurring depends on the difficulty of the procedure and whether you are sensitive to radiation due to previous procedures, disease, or genetic conditions.   IF your procedure requires a prolonged use of radiation, you will be notified and given written instructions for further action.  It is your responsibility to monitor  the irradiated area for the 2 weeks following the procedure and to notify your physician if you are concerned that you have suffered a radiation induced injury.    All of the patient's questions were answered, patient is agreeable to proceed.  Consent signed and in chart.   Thank you for this interesting consult.  I greatly enjoyed meeting [name] and look forward to participating in their care.  A copy of this report was sent to the requesting provider on this date.  Electronically Signed: Alene Mires, NP 09/24/2022, 8:16 AM   I spent a total of    15 Minutes in face to face in clinical consultation, greater than 50% of which was counseling/coordinating care for cerebral angiogram

## 2022-09-24 ENCOUNTER — Other Ambulatory Visit (HOSPITAL_COMMUNITY): Payer: Self-pay | Admitting: Interventional Radiology

## 2022-09-24 ENCOUNTER — Ambulatory Visit (HOSPITAL_COMMUNITY)
Admission: RE | Admit: 2022-09-24 | Discharge: 2022-09-24 | Disposition: A | Discharge status: Home / Self Care | Payer: HMO | Source: Ambulatory Visit | Attending: Interventional Radiology | Admitting: Interventional Radiology

## 2022-09-24 DIAGNOSIS — I6621 Occlusion and stenosis of right posterior cerebral artery: Secondary | ICD-10-CM | POA: Insufficient documentation

## 2022-09-24 DIAGNOSIS — I771 Stricture of artery: Secondary | ICD-10-CM

## 2022-09-24 DIAGNOSIS — E7800 Pure hypercholesterolemia, unspecified: Secondary | ICD-10-CM | POA: Diagnosis not present

## 2022-09-24 DIAGNOSIS — I10 Essential (primary) hypertension: Secondary | ICD-10-CM | POA: Diagnosis not present

## 2022-09-24 HISTORY — PX: IR ANGIO VERTEBRAL SEL VERTEBRAL UNI L MOD SED: IMG5367

## 2022-09-24 HISTORY — PX: IR ANGIO VERTEBRAL SEL SUBCLAVIAN INNOMINATE UNI R MOD SED: IMG5365

## 2022-09-24 HISTORY — PX: IR ANGIO INTRA EXTRACRAN SEL COM CAROTID INNOMINATE BILAT MOD SED: IMG5360

## 2022-09-24 HISTORY — PX: IR US GUIDE VASC ACCESS RIGHT: IMG2390

## 2022-09-24 LAB — BASIC METABOLIC PANEL
Anion gap: 10 (ref 5–15)
BUN: 16 mg/dL (ref 8–23)
CO2: 26 mmol/L (ref 22–32)
Calcium: 9 mg/dL (ref 8.9–10.3)
Chloride: 106 mmol/L (ref 98–111)
Creatinine, Ser: 1.07 mg/dL (ref 0.61–1.24)
GFR, Estimated: >60 mL/min (ref >60)
Glucose, Bld: 110 mg/dL — ABNORMAL HIGH (ref 70–99)
Potassium: 3.5 mmol/L (ref 3.5–5.1)
Sodium: 142 mmol/L (ref 135–145)

## 2022-09-24 LAB — CBC
HCT: 40.4 % (ref 39.0–52.0)
Hemoglobin: 14.3 g/dL (ref 13.0–17.0)
MCH: 31.4 pg (ref 26.0–34.0)
MCHC: 35.4 g/dL (ref 30.0–36.0)
MCV: 88.6 fL (ref 80.0–100.0)
Platelets: 153 10*3/uL (ref 150–400)
RBC: 4.56 MIL/uL (ref 4.22–5.81)
RDW: 12.9 % (ref 11.5–15.5)
WBC: 4.3 10*3/uL (ref 4.0–10.5)
nRBC: 0 % (ref 0.0–0.2)

## 2022-09-24 LAB — PROTIME-INR
INR: 1 (ref 0.8–1.2)
Prothrombin Time: 13.4 seconds (ref 11.4–15.2)

## 2022-09-24 MED ORDER — NITROGLYCERIN 1 MG/10 ML FOR IR/CATH LAB
INTRA_ARTERIAL | Status: AC
  Filled 2022-09-24: qty 10

## 2022-09-24 MED ORDER — SODIUM CHLORIDE 0.9 % IV SOLN: Freq: Once | INTRAVENOUS | Status: AC

## 2022-09-24 MED ORDER — SODIUM CHLORIDE 0.9 % IV SOLN: INTRAVENOUS | Status: AC

## 2022-09-24 MED ORDER — FENTANYL CITRATE (PF) 100 MCG/2ML IJ SOLN
INTRAMUSCULAR | Status: AC | PRN
  Administered 2022-09-24: 25 ug via INTRAVENOUS

## 2022-09-24 MED ORDER — NITROGLYCERIN 1 MG/10 ML FOR IR/CATH LAB
INTRA_ARTERIAL | Status: AC | PRN
  Administered 2022-09-24: 200 ug via INTRA_ARTERIAL

## 2022-09-24 MED ORDER — LIDOCAINE HCL 1 % IJ SOLN
INTRAMUSCULAR | Status: AC
  Filled 2022-09-24: qty 20

## 2022-09-24 MED ORDER — HYDRALAZINE HCL 20 MG/ML IJ SOLN
INTRAMUSCULAR | Status: AC | PRN
  Administered 2022-09-24: 5 mg via INTRAVENOUS

## 2022-09-24 MED ORDER — MIDAZOLAM HCL 2 MG/2ML IJ SOLN
INTRAMUSCULAR | Status: AC | PRN
  Administered 2022-09-24: 1 mg via INTRAVENOUS

## 2022-09-24 MED ORDER — IOHEXOL 300 MG/ML  SOLN
150.0000 mL | Freq: Once | INTRAMUSCULAR | Status: AC | PRN
  Administered 2022-09-24: 80 mL via INTRA_ARTERIAL

## 2022-09-24 MED ORDER — VERAPAMIL HCL 2.5 MG/ML IV SOLN: INTRA_ARTERIAL | Status: AC | PRN

## 2022-09-24 MED ORDER — VERAPAMIL HCL 2.5 MG/ML IV SOLN
INTRAVENOUS | Status: AC
  Filled 2022-09-24: qty 2

## 2022-09-24 MED ORDER — FENTANYL CITRATE (PF) 100 MCG/2ML IJ SOLN
INTRAMUSCULAR | Status: AC
  Filled 2022-09-24: qty 2

## 2022-09-24 MED ORDER — HYDRALAZINE HCL 20 MG/ML IJ SOLN
INTRAMUSCULAR | Status: AC
  Filled 2022-09-24: qty 1

## 2022-09-24 MED ORDER — MIDAZOLAM HCL 2 MG/2ML IJ SOLN
INTRAMUSCULAR | Status: AC
  Filled 2022-09-24: qty 2

## 2022-09-24 MED ORDER — HEPARIN SODIUM (PORCINE) 1000 UNIT/ML IJ SOLN
INTRAMUSCULAR | Status: AC
  Filled 2022-09-24: qty 10

## 2022-09-24 NOTE — Progress Notes (Signed)
Quick clot removed right groin and right groin stable, no bleeding or hematoma

## 2022-09-24 NOTE — Sedation Documentation (Signed)
Right radial TR band applied at 1020 with 11cc of air. Site remains level 0, pulse intact.  Right femoral sheath pulled at 1034  and pressure was held until 1054. Groin site is level 0, quick clot and tegaderm applied. Pulses intact.

## 2022-09-24 NOTE — Procedures (Addendum)
INR.  Status post four-vessel cerebral arteriogram per  Right common femoral artery approach and right radial approach.  Findings.  Severe high-grade stenosis of the right posterior cerebral artery in the proximal P2 region.  Approximately  75 % stenosis of the proximal   RT subclavian artery  Fatima Sanger MD.

## 2022-09-24 NOTE — Discharge Instructions (Signed)
Femoral Site Care This sheet gives you information about how to care for yourself after your procedure. Your health care provider may also give you more specific instructions. If you have problems or questions, contact your health care provider. What can I expect after the procedure?  After the procedure, it is common to have: Bruising that usually fades within 1-2 weeks. Tenderness at the site. Follow these instructions at home: Wound care Follow instructions from your health care provider about how to take care of your insertion site. Make sure you: Wash your hands with soap and water before you change your bandage (dressing). If soap and water are not available, use hand sanitizer. Remove your dressing as told by your health care provider. In 24 hours Do not take baths, swim, or use a hot tub until your health care provider approves. You may shower 24-48 hours after the procedure or as told by your health care provider. Gently wash the site with plain soap and water. Pat the area dry with a clean towel. Do not rub the site. This may cause bleeding. Do not apply powder or lotion to the site. Keep the site clean and dry. Check your femoral site every day for signs of infection. Check for: Redness, swelling, or pain. Fluid or blood. Warmth. Pus or a bad smell. Activity For the first 2-3 days after your procedure, or as long as directed: Avoid climbing stairs as much as possible. Do not squat. Do not lift anything that is heavier than 10 lb (4.5 kg), or the limit that you are told, until your health care provider says that it is safe. For 5 days Rest as directed. Avoid sitting for a long time without moving. Get up to take short walks every 1-2 hours. Do not drive for 24 hours if you were given a medicine to help you relax (sedative). General instructions Take over-the-counter and prescription medicines only as told by your health care provider. Keep all follow-up visits as told by  your health care provider. This is important. Contact a health care provider if you have: A fever or chills. You have redness, swelling, or pain around your insertion site. Get help right away if: The catheter insertion area swells very fast. You pass out. You suddenly start to sweat or your skin gets clammy. The catheter insertion area is bleeding, and the bleeding does not stop when you hold steady pressure on the area. The area near or just beyond the catheter insertion site becomes pale, cool, tingly, or numb. These symptoms may represent a serious problem that is an emergency. Do not wait to see if the symptoms will go away. Get medical help right away. Call your local emergency services (911 in the [place] Do not drive yourself to the hospital. Summary After the procedure, it is common to have bruising that usually fades within 1-2 weeks. Check your femoral site every day for signs of infection. Do not lift anything that is heavier than 10 lb (4.5 kg), or the limit that you are told, until your health care provider says that it is safe. This information is not intended to replace advice given to you by your health care provider. Make sure you discuss any questions you have with your health care provider. Document Revised: 06/06/2017 Document Reviewed: 06/06/2017 Elsevier Patient Education  2020 [employer] 

## 2022-09-27 ENCOUNTER — Other Ambulatory Visit (HOSPITAL_COMMUNITY): Payer: Self-pay | Admitting: Interventional Radiology

## 2022-09-27 DIAGNOSIS — I771 Stricture of artery: Secondary | ICD-10-CM

## 2022-09-28 ENCOUNTER — Encounter (HOSPITAL_COMMUNITY): Payer: Self-pay

## 2022-09-28 ENCOUNTER — Other Ambulatory Visit (HOSPITAL_COMMUNITY): Payer: Self-pay | Admitting: Interventional Radiology

## 2022-09-28 DIAGNOSIS — I771 Stricture of artery: Secondary | ICD-10-CM

## 2022-09-29 ENCOUNTER — Ambulatory Visit (HOSPITAL_COMMUNITY)
Admission: RE | Admit: 2022-09-29 | Discharge: 2022-09-29 | Disposition: A | Discharge status: Home / Self Care | Payer: HMO | Source: Ambulatory Visit | Attending: Interventional Radiology | Admitting: Interventional Radiology

## 2022-09-29 DIAGNOSIS — I771 Stricture of artery: Secondary | ICD-10-CM

## 2022-09-29 DIAGNOSIS — I6621 Occlusion and stenosis of right posterior cerebral artery: Secondary | ICD-10-CM | POA: Diagnosis not present

## 2022-09-30 HISTORY — PX: IR RADIOLOGIST EVAL & MGMT: IMG5224

## 2022-10-04 ENCOUNTER — Telehealth (HOSPITAL_COMMUNITY): Payer: Self-pay

## 2022-10-04 ENCOUNTER — Other Ambulatory Visit (HOSPITAL_COMMUNITY): Payer: Self-pay | Admitting: Interventional Radiology

## 2022-10-04 DIAGNOSIS — I771 Stricture of artery: Secondary | ICD-10-CM

## 2022-10-04 NOTE — Telephone Encounter (Signed)
Called to let pt know that we are still waiting on insurance, no answer, left vm. AB

## 2022-10-05 ENCOUNTER — Other Ambulatory Visit: Payer: Self-pay | Admitting: Radiology

## 2022-10-05 DIAGNOSIS — I771 Stricture of artery: Secondary | ICD-10-CM

## 2022-10-06 ENCOUNTER — Inpatient Hospital Stay (HOSPITAL_COMMUNITY): Payer: HMO

## 2022-10-06 DIAGNOSIS — I63531 Cerebral infarction due to unspecified occlusion or stenosis of right posterior cerebral artery: Secondary | ICD-10-CM | POA: Diagnosis not present

## 2022-10-08 ENCOUNTER — Other Ambulatory Visit: Payer: Self-pay | Admitting: Student

## 2022-10-08 ENCOUNTER — Other Ambulatory Visit: Payer: Self-pay | Admitting: Internal Medicine

## 2022-10-08 ENCOUNTER — Encounter (HOSPITAL_COMMUNITY): Payer: Self-pay | Admitting: Interventional Radiology

## 2022-10-08 NOTE — Anesthesia Preprocedure Evaluation (Signed)
Anesthesia Evaluation  Patient identified by MRN, date of birth, ID band Patient awake    Reviewed: Allergy & Precautions, NPO status , Patient's Chart, lab work & pertinent test results  History of Anesthesia Complications Negative for: history of anesthetic complications  Airway Mallampati: III  TM Distance: >3 FB Neck ROM: Full    Dental  (+) Dental Advisory Given   Pulmonary neg pulmonary ROS   Pulmonary exam normal breath sounds clear to auscultation       Cardiovascular hypertension (HCTZ, losartan), Pt. on medications (-) angina + CAD  (-) Past MI and (-) Cardiac Stents (-) dysrhythmias  Rhythm:Regular Rate:Normal  HLD, right subclavian artery stenosis  TTE 09/04/2022: Impression    1. Normal left ventricular systolic function with a calculated ejection fraction of 55%.   2. Normal right ventricular size and systolic function.   3. No significant valvular abnormalities.   Coronary CT 09/18/2020: IMPRESSION: 1. Coronary calcium score of 170. This was 50 percentile for age and sex matched control.   2. Normal coronary origin with Co-dominance.   3. Mild Coronary artery disease. CAD-RADS 2. Mild non-obstructive CAD (25-49%). Consider non-atherosclerotic causes of chest pain. Consider preventive therapy and risk factor modification.     Neuro/Psych neg Seizures paresthesias  Neuromuscular disease (lumbosacral radiculopathy)    GI/Hepatic negative GI ROS, Neg liver ROS,,,  Endo/Other  negative endocrine ROS    Renal/GU negative Renal ROS     Musculoskeletal  (+) Arthritis , Osteoarthritis,    Abdominal   Peds  Hematology negative hematology ROS (+)   Anesthesia Other Findings High-grade smooth stenosis of the right posterior cerebral artery P2  Last Plavix: today  Reproductive/Obstetrics                             Anesthesia Physical Anesthesia Plan  ASA:  3  Anesthesia Plan: General   Post-op Pain Management:    Induction: Intravenous  PONV Risk Score and Plan: 2 and Ondansetron, Dexamethasone and Treatment may vary due to age or medical condition  Airway Management Planned: Oral ETT  Additional Equipment: Arterial line  Intra-op Plan:   Post-operative Plan: Extubation in OR  Informed Consent: I have reviewed the patients History and Physical, chart, labs and discussed the procedure including the risks, benefits and alternatives for the proposed anesthesia with the patient or authorized representative who has indicated his/her understanding and acceptance.     Dental advisory given  Plan Discussed with: CRNA and Anesthesiologist  Anesthesia Plan Comments: (PAT note by Antionette Poles, PA-C:  Follows cardiology for history of HTN, HLD, nonobstructive CAD by coronary CT 2022. Seen by Perlie Gold, PA-C for preop eval 08/24/22. Per note, "Patient with planned total right knee arthroplasty with Dr. Lequita Halt on 4/30. According to the Revised Cardiac Risk Index (RCRI), his Perioperative Risk of Major Cardiac Event is (%): 0.4 His Functional Capacity in METs is: 8.91 according to the Duke Activity Status Index (DASI). Patient with elevated BP during OV today after stopping anti-hypertensive regimen. He will resume HCTZ and Losartan today with 4 week follow up scheduled. Assuming improved BP at this follow up, based on ACC/AHA guidelines, patient would be at acceptable risk for the planned procedure without further cardiovascular testing."  Pt was subsequently seen at the ED on 09/18/22 for left visual field cut. CTA showed right PCA stenosis. MRI brain showed no stroke. Followed up outpatient with Dr. Corliss Skains and had arteriogram showing severe high-grade  stenosis of the right posterior cerebral artery in the proximal P2 region. Approximately  75 % stenosis of the proximal   RT subclavian artery.  BMP and CBC 09/24/2022 reviewed, WNL  EKG  09/18/22: Sinus rhythm with marked sinus arrhythmia. Rate 77.  TTE 10/02/2020: 1. Left ventricular ejection fraction, by estimation, is 50 to 55%. Left  ventricular ejection fraction by 3D volume is 53 %. The left ventricle has  low normal function. The left ventricle has no regional wall motion  abnormalities. Left ventricular  diastolic parameters were normal. The average left ventricular global  longitudinal strain is -22.2 %. The global longitudinal strain is normal.  2. Right ventricular systolic function is normal. The right ventricular  size is normal. Tricuspid regurgitation signal is inadequate for assessing  PA pressure.  3. The mitral valve is normal in structure. Trivial mitral valve  regurgitation. No evidence of mitral stenosis.  4. The aortic valve is tricuspid. Aortic valve regurgitation is not  visualized. No aortic stenosis is present.  5. The inferior vena cava is normal in size with greater than 50%  respiratory variability, suggesting right atrial pressure of 3 mmHg.   Comparison(s): No prior Echocardiogram.   Conclusion(s)/Recommendation(s): Normal biventricular function without  evidence of hemodynamically significant valvular heart disease.   Coronary CT 09/18/20: IMPRESSION: 1. Coronary calcium score of 170. This was 50 percentile for age and sex matched control.  2. Normal coronary origin with Co-dominance.  3. Mild Coronary artery disease. CAD-RADS 2. Mild non-obstructive CAD (25-49%). Consider non-atherosclerotic causes of chest pain. Consider preventive therapy and risk factor modification.  Risks of general anesthesia discussed including, but not limited to, sore throat, hoarse voice, chipped/damaged teeth, injury to vocal cords, nausea and vomiting, allergic reactions, lung infection, heart attack, stroke, and death. All questions answered.  )        Anesthesia Quick Evaluation

## 2022-10-08 NOTE — Progress Notes (Signed)
Anesthesia Chart Review: Same day workup  Follows cardiology for history of HTN, HLD, nonobstructive CAD by coronary CT 2022. Seen by Perlie Gold, PA-C for preop eval 08/24/22. Per note, "Patient with planned total right knee arthroplasty with Dr. Lequita Halt on 4/30. According to the Revised Cardiac Risk Index (RCRI), his Perioperative Risk of Major Cardiac Event is (%): 0.4 His Functional Capacity in METs is: 8.91 according to the Duke Activity Status Index (DASI). Patient with elevated BP during OV today after stopping anti-hypertensive regimen. He will resume HCTZ and Losartan today with 4 week follow up scheduled. Assuming improved BP at this follow up, based on ACC/AHA guidelines, patient would be at acceptable risk for the planned procedure without further cardiovascular testing."  Pt was subsequently seen at the ED on 09/18/22 for left visual field cut. CTA showed right PCA stenosis. MRI brain showed no stroke. Followed up outpatient with Dr. Corliss Skains and had arteriogram showing severe high-grade stenosis of the right posterior cerebral artery in the proximal P2 region. Approximately  75 % stenosis of the proximal   RT subclavian artery.  BMP and CBC 09/24/2022 reviewed, WNL  EKG 09/18/22: Sinus rhythm with marked sinus arrhythmia. Rate 77.  TTE 10/02/2020:  1. Left ventricular ejection fraction, by estimation, is 50 to 55%. Left  ventricular ejection fraction by 3D volume is 53 %. The left ventricle has  low normal function. The left ventricle has no regional wall motion  abnormalities. Left ventricular  diastolic parameters were normal. The average left ventricular global  longitudinal strain is -22.2 %. The global longitudinal strain is normal.   2. Right ventricular systolic function is normal. The right ventricular  size is normal. Tricuspid regurgitation signal is inadequate for assessing  PA pressure.   3. The mitral valve is normal in structure. Trivial mitral valve  regurgitation. No  evidence of mitral stenosis.   4. The aortic valve is tricuspid. Aortic valve regurgitation is not  visualized. No aortic stenosis is present.   5. The inferior vena cava is normal in size with greater than 50%  respiratory variability, suggesting right atrial pressure of 3 mmHg.   Comparison(s): No prior Echocardiogram.   Conclusion(s)/Recommendation(s): Normal biventricular function without  evidence of hemodynamically significant valvular heart disease.   Coronary CT 09/18/20: IMPRESSION: 1. Coronary calcium score of 170. This was 50 percentile for age and sex matched control.   2. Normal coronary origin with Co-dominance.   3. Mild Coronary artery disease. CAD-RADS 2. Mild non-obstructive CAD (25-49%). Consider non-atherosclerotic causes of chest pain. Consider preventive therapy and risk factor modification.   [facility]/Anesthesiology Phone [phone] 10/08/2022 11:51 AM

## 2022-10-08 NOTE — Progress Notes (Addendum)
PCP - Aliene Beams, MD Cardiologist - Laurance Flatten, MD  EKG - 09/20/2022 Chest x-ray - 09/10/2022 ECHO - 09/27/2020 Cardiac Cath - 2012  CPAP - Denies  DM-  Denies   Blood Thinner Instructions: Per pt, was told by MD to take Plavix on DOS Aspirin Instructions: Per pt, was told by MD to take  on DOS  ERAS Protcol - N/A NPO orders  COVID TEST- N/A.  Pt denies any symptoms  Anesthesia review: Yes, cardiac hx  -------------  SDW INSTRUCTIONS:  Your procedure is scheduled on Monday May 6. Please report to [facility] Main Entrance "A" at 0600 A.M., and check in at the Admitting office. Call this number if you have problems the morning of surgery: [phone]   Remember: Do not eat or drink after midnight the night before your surgery     Medications to take morning of surgery with a sip of water include:  rosuvastatin (CRESTOR)  clopidogrel (PLAVIX)  aspirin EC 81   As of today, STOP taking any Aspirin (unless otherwise instructed by your surgeon), Aleve, Naproxen, Ibuprofen, Motrin, Advil, Goody's, BC's, all herbal medications, fish oil, and all vitamins including Coenzyme Q10, Multiple Vitamin (MULTIVITAMIN), TURMERIC PO     The Morning of Surgery Do not wear jewelry, make-up or nail polish. Do not wear lotions, powders, or perfumes/colognes, or deodorant Do not bring valuables to the hospital. [facility] is not responsible for any belongings or valuables.  If you are a smoker, DO NOT Smoke 24 hours prior to surgery  If you wear a CPAP at night please bring your mask the morning of surgery   Remember that you must have someone to transport you home after your surgery, and remain with you for 24 hours if you are discharged the same day.  Please bring cases for contacts, glasses, hearing aids, dentures or bridgework because it cannot be worn into surgery.   Patients discharged the day of surgery will not be allowed to drive home.   Please shower the NIGHT  BEFORE/MORNING OF SURGERY (use antibacterial soap like DIAL soap if possible). Wear comfortable clothes the morning of surgery. Oral Hygiene is also important to reduce your risk of infection.  Remember - BRUSH YOUR TEETH THE MORNING OF SURGERY WITH YOUR REGULAR TOOTHPASTE  Patient denies shortness of breath, fever, cough and chest pain.

## 2022-10-09 ENCOUNTER — Encounter (HOSPITAL_COMMUNITY): Payer: Self-pay

## 2022-10-09 NOTE — H&P (Signed)
  The note originally documented on this encounter has been moved the the encounter in which it belongs.  

## 2022-10-09 NOTE — H&P (Signed)
Chief Complaint: Right  posterior cerebral artery P2 stenosis. Patient presents for angioplasty and possible stent placement   Supervising Physician: Julieanne Cotton  Patient Status: [facility] - Out-pt  History of Present Illness: [name] is a 72 y.o. male outpatient. History of L5 radiculopathy, HTN, HLD, OA. Recently diagnosed with right PCA stenosis and stenosis of the right sub clavian vein at OSH when he presented with left sided hemianopia that self resolved. On September 18, 2022 Mr, [name] presented to the emergency department [facility] on September 18, 2022 with an onset of left sided hemianopia. Patient described the episode as self resolving with rest. Patient presented to the emergency room when a second episode occurred later that evening. The second episode again, self resolved. CT Angio head, neck, width, and without contrast from September 18, 2022 reads. Fear right sided P2 PCA stenosis. Patient and his wife were seen in the Neuro interventional radiology clinic by Doctor Dr. Julieanne Cotton on September 22, 2022. There are a detailed discussion regarding the patient's condition as well as treatment options were discussed.  The Patient and his wife agreed to a Cerebral angiogram. This was performed on 4.19.24 by Dr. Julieanne Cotton. Patient was found a High-grade smooth stenosis of the right posterior cerebral artery P2 Segment at that time. Patient presents for percutaneous transluminal coronary angioplasty with stenting with general anesthesia.   Wife at bedside. Patient alert and laying in bed,calm. Denies any fevers, headache, chest pain, SOB, cough, abdominal pain, nausea, vomiting or bleeding. Patient states that he has had ongoing visual issues that self resolve. No issues at this time. Return precautions and treatment recommendations and follow-up discussed with the patient and his wife at bedside both who are agreeable with the plan.   Past Medical History:  Diagnosis Date    COVID    DJD (degenerative joint disease) of knee    Dyspnea on exertion    Hearing loss    Hypercholesteremia    Hypertension    Overweight    Paresthesias    Personal history of colonic polyps    Primary osteoarthritis of both knees    Skin mole    Spinal stenosis    Stenosis of right subclavian artery (HCC)     Past Surgical History:  Procedure Laterality Date   APPENDECTOMY     CARDIAC CATHETERIZATION     HERNIA REPAIR     IR ANGIO INTRA EXTRACRAN SEL COM CAROTID INNOMINATE BILAT MOD SED  09/24/2022   IR ANGIO VERTEBRAL SEL SUBCLAVIAN INNOMINATE UNI R MOD SED  09/24/2022   IR ANGIO VERTEBRAL SEL VERTEBRAL UNI L MOD SED  09/24/2022   IR RADIOLOGIST EVAL & MGMT  09/22/2022   IR RADIOLOGIST EVAL & MGMT  09/30/2022   IR US GUIDE VASC ACCESS RIGHT  09/24/2022   KNEE SURGERY Right    SHOULDER SURGERY Left     Allergies: Patient has no known allergies.  Medications: Prior to Admission medications   Medication Sig Start Date End Date Taking? Authorizing Provider  aspirin EC 81 MG tablet Take 81 mg by mouth daily. Swallow whole.    [provider]  clopidogrel (PLAVIX) 75 MG tablet Take 75 mg by mouth daily.    [provider]  Coenzyme Q10 200 MG capsule Take 200 mg by mouth daily.    [provider]  hydrochlorothiazide (MICROZIDE) 12.5 MG capsule Take 1 capsule (12.5 mg total) by mouth daily. 08/24/22   Perlie Gold, PA-C  losartan (COZAAR) 100  MG tablet Take 1 tablet (100 mg total) by mouth daily. 08/24/22   Perlie Gold, PA-C  Multiple Vitamin (MULTIVITAMIN) tablet Take 1 tablet by mouth daily.    [provider]  rosuvastatin (CRESTOR) 20 MG tablet Take 1 tablet (20 mg total) by mouth daily. Patient not taking: Reported on 10/07/2022 08/24/22   Perlie Gold, PA-C  rosuvastatin (CRESTOR) 40 MG tablet Take 1 tablet (40 mg total) by mouth daily. 09/17/22     sildenafil (VIAGRA) 50 MG tablet Take 50 mg by mouth daily as needed for erectile  dysfunction. 08/26/22   [provider]  TURMERIC PO Take 1,500 mg by mouth daily.    [provider]     Family History  Problem Relation Age of Onset   CVA Brother     Social History   Socioeconomic History   Marital status: Married    Spouse name: Not on file   Number of children: Not on file   Years of education: Not on file   Highest education level: Not on file  Occupational History   Not on file  Tobacco Use   Smoking status: Never   Smokeless tobacco: Never  Vaping Use   Vaping Use: Never used  Substance and Sexual Activity   Alcohol use: Yes    Comment: occasional   Drug use: Never   Sexual activity: Not on file  Other Topics Concern   Not on file  Social History Narrative   Caffeine occasional coffee   Education: masters.   Retired. From ministry.    Social Determinants of Health   Financial Resource Strain: Not on file  Food Insecurity: Not on file  Transportation Needs: Not on file  Physical Activity: Not on file  Stress: Not on file  Social Connections: Not on file    Review of Systems: A 12 point ROS discussed and pertinent positives are indicated in the HPI above.  All other systems are negative.  Review of Systems  Constitutional:  Negative for fever.  HENT:  Negative for congestion.   Respiratory:  Negative for cough and shortness of breath.   Cardiovascular:  Negative for chest pain.  Gastrointestinal:  Negative for abdominal pain.  Neurological:  Negative for headaches.  Psychiatric/Behavioral:  Negative for behavioral problems and confusion.     Vital Signs: 135/85, rr 17, HR 70 tem 98.2  Physical Exam Vitals and nursing note reviewed.  Constitutional:      Appearance: He is well-developed.  HENT:     Head: Normocephalic.  Cardiovascular:     Rate and Rhythm: Normal rate and regular rhythm.  Pulmonary:     Effort: Pulmonary effort is normal.  Musculoskeletal:        General: Normal range of motion.      Cervical back: Normal range of motion.  Skin:    General: Skin is dry.  Neurological:     General: No focal deficit present.     Mental Status: He is alert and oriented to person, place, and time.  Psychiatric:        Mood and Affect: Mood normal.        Behavior: Behavior normal.        Thought Content: Thought content normal.        Judgment: Judgment normal.     Imaging: IR Radiologist Eval & Mgmt  Result Date: 09/30/2022 EXAM: ESTABLISHED PATIENT OFFICE VISIT CHIEF COMPLAINT: Follow-up to the recent diagnostic catheter arteriogram findings. Current Pain Level: 1-10 HISTORY OF  PRESENT ILLNESS: 72 year old right-handed gentleman who presents for follow-up accompanied by his spouse. Clinically, the patient reports at least 2 episodes of transient left-sided visual field deficit lasting a few seconds associated with mild lightheadedness today. Apart from these the patient reports having had no significant symptoms. The wife reports he feels as though he does not have energy over the past couple of days. Otherwise, he denies any blindness, amaurosis fugax, diplopia, nausea, vomiting, gait or balance difficulties, or perioral numbness or speech abnormality or motor weakness. The patient reports his medications diligently. The risk of the history is unchanged compared to the most recent H and P about 2 weeks ago. Past Medical History: Unchanged. Medications: Unchanged. Allergies: Unchanged. Social History: Unchanged. Family History: Unchanged. REVIEW OF SYSTEMS: Negative unless as mentioned above. PHYSICAL EXAMINATION: Remained alert, awake, oriented. Appropriately responsive with clear speech. No gross abnormal lateralizing neurological features. ASSESSMENT AND PLAN: Discussion ensued regarding the high-grade severe focal stenosis of the right posterior cerebral artery P2 segment which is probably responsible for the patient's persistent symptoms for the past few weeks. Management considerations were  those of endovascular revascularization of the symptomatic high-grade stenosis of the right PCA with angioplasty plus or minus stenting under general anesthesia via the transfemoral route. The procedure was reviewed in detail. The reason for treatment is to prevent potential debilitating ischemic event in the event of complete occlusion of the right posterior cerebral artery. Also the patient is failing medical management despite adequate dual antiplatelets inhibition. The risk of the procedure was that of a 1 to 3% chance of an ischemic event with potential for worsening neurological condition. Also discussed was the possibility of a hemorrhage which could be fatal. More aggressive management in the form of addition of anticoagulation in addition to the dual antiplatelets were also reviewed. It was felt this would have little impact on the overall natural history of the symptomatic intracranial arteriosclerotic disease. The patient and spouse would like to proceed with the endovascular option under general anesthesia. This will be scheduled as soon as possible in the next few days. Patient advised to maintain adequate hydration. Electronically Signed   By: Julieanne Cotton M.D.   On: 09/30/2022 07:42   IR ANGIO INTRA EXTRACRAN SEL COM CAROTID INNOMINATE BILAT MOD SED  Result Date: 09/27/2022 CLINICAL DATA:  Intermittent left-sided visual field deficit associated with lightheadedness. Abnormal CT angiogram of the head and neck significant for severe stenosis of the right posterior cerebral artery P2 segment. EXAM: BILATERAL COMMON CAROTID AND INNOMINATE ANGIOGRAPHY COMPARISON:  CT angiogram of the head and neck of September 18, 2022. MEDICATIONS: Heparin 2000 units IV. No antibiotic was administered within 1 hour of the procedure. ANESTHESIA/SEDATION: Versed 1 mg IV; Fentanyl 25 mcg IV. Moderate Sedation Time:  89 minutes The patient was continuously monitored during the procedure by the interventional radiology  nurse under my direct supervision. CONTRAST:  Omnipaque 300 70 cc. FLUOROSCOPY TIME:  Fluoroscopy Time: 17 minutes 48 seconds (800 mGy). COMPLICATIONS: None immediate. TECHNIQUE: Informed written consent was obtained from the patient after a thorough discussion of the procedural risks, benefits and alternatives. All questions were addressed. Maximal Sterile Barrier Technique was utilized including caps, mask, sterile gowns, sterile gloves, sterile drape, hand hygiene and skin antiseptic. A timeout was performed prior to the initiation of the procedure. The right forearm to the wrist was prepped and draped in the usual sterile manner. The right radial artery was identified with ultrasound, and its morphology documented in the radiology PACS system.  A dorsal palmar anastomosis was verified to be present. Using ultrasound guidance access into the right radial artery was obtained with a 4/5 French sheath over an 018 inch micro guidewire. The micro guidewire, and the obturator were removed. Good aspiration obtained from the side port of the radial sheath. A cocktail of 2000 units of heparin, 200 mcg of nitroglycerin, and 2.5 mg of verapamil was then infused in diluted form without event. A right radial arteriogram was performed. Over an 035 inch Roadrunner guidewire, a Simmons 2 diagnostic catheter was advanced to the aortic arch region, an arteriogram was performed at the origin of the right vertebral artery. The [place] 2 5 [place] diagnostic catheter could not be formed due to the tortuosity of the right subclavian artery proximally. A wrist band was then applied at the right radial puncture site with a distal right radial pulse verified to be present. The right groin was prepped and draped in the usual sterile fashion. Thereafter using modified Seldinger technique, transfemoral access into the right common femoral artery was obtained without difficulty. Over an 0.035 inch guidewire, a 5 French Pinnacle sheath was  inserted. Through this, and also over an 0.035 inch guidewire, a 5 [place] JB 1 catheter was advanced to the aortic arch region and selectively positioned in the right common carotid artery, the left common carotid artery and the left vertebral artery. FINDINGS: The right vertebral artery origin is widely patent. The non dominant vertebral artery is seen to opacify to the cranial skull base. The right subclavian artery is seen to be fusiformly dilated proximally associated with approximately 75% stenosis. However, no right vertebral artery steal phenomenon is noticed at this time. The right common carotid arteriogram demonstrates the right external carotid artery and its major branches to be widely patent. The right internal carotid artery at the bulb to the cranial skull base demonstrates wide opacification. The petrous, the cavernous and the supraclinoid right ICA are widely patent, with the tapered mild-to-moderate stenosis of the proximal supraclinoid ICA just at the level of the origin of the right posterior communicating artery. The right posterior communicating artery demonstrates opacification into the right posterior cerebral artery distribution with a tapered severe stenosis in the proximal P2 segment. The right middle cerebral artery and the right anterior cerebral artery opacify into the capillary and venous phases. The right middle cerebral artery at its origin demonstrates a well-defined focal area of absent contrast which may represent a small fenestration. The left common carotid arteriogram demonstrates the left external carotid artery and its branches to be widely patent. The left internal carotid artery at the bulb to the cranial skull base is widely patent. The petrous, the cavernous and the supraclinoid left ICA are widely patent. A left posterior communicating artery demonstrates opacification of the left distribution with a mild to moderate areas of smooth narrowing in the distal P2 region  probably representing intracranial arteriosclerosis. The left middle cerebral artery and the left anterior cerebral artery opacify into the capillary and venous phases. Transient cross-filling via the anterior communicating artery of the right anterior cerebral A2 segment is seen. The left vertebral artery origin is widely patent. The vessel demonstrates moderate tortuosity just distal to this. More distally, the vessel is seen to opacify to the cranial skull base. Opacification is seen to the cranial skull base. Patency is maintained of the left vertebrobasilar junction and the left posterior-inferior cerebellar artery. Retrograde opacification of the right vertebrobasilar junction from the left vertebral artery is seen to the proximal  to the right posterior-inferior cerebellar artery. The superior cerebellar arteries and the anterior-inferior cerebellar arteries demonstrate patency with focal areas of mild caliber irregularity. Mild tapered narrowing of the distal basilar artery is seen to the origin of the right posterior cerebral artery. The right posterior cerebral artery demonstrates mild stenosis at the P1 PCOM junction. Distal to this there is a severe stenosis of the right posterior cerebral artery P2 region with distal flow noted. No opacification is seen of the left posterior cerebral artery at its origin. There is a prominent artery of Percheron supplying the thalamus. IMPRESSION: High-grade smooth stenosis of the right posterior cerebral artery P2 segment. Approximately 75% stenosis of the proximal right subclavian artery with post stenotic dilatation. Mild-to-moderate stenosis of the right posterior cerebral artery proximally. PLAN: Follow-up in the clinic to discuss the above angiographic findings. Electronically Signed   By: Julieanne Cotton M.D.   On: 09/27/2022 08:14   IR US Guide Vasc Access Right  Result Date: 09/27/2022 CLINICAL DATA:  Intermittent left-sided visual field deficit  associated with lightheadedness. Abnormal CT angiogram of the head and neck significant for severe stenosis of the right posterior cerebral artery P2 segment. EXAM: BILATERAL COMMON CAROTID AND INNOMINATE ANGIOGRAPHY COMPARISON:  CT angiogram of the head and neck of September 18, 2022. MEDICATIONS: Heparin 2000 units IV. No antibiotic was administered within 1 hour of the procedure. ANESTHESIA/SEDATION: Versed 1 mg IV; Fentanyl 25 mcg IV. Moderate Sedation Time:  89 minutes The patient was continuously monitored during the procedure by the interventional radiology nurse under my direct supervision. CONTRAST:  Omnipaque 300 70 cc. FLUOROSCOPY TIME:  Fluoroscopy Time: 17 minutes 48 seconds (800 mGy). COMPLICATIONS: None immediate. TECHNIQUE: Informed written consent was obtained from the patient after a thorough discussion of the procedural risks, benefits and alternatives. All questions were addressed. Maximal Sterile Barrier Technique was utilized including caps, mask, sterile gowns, sterile gloves, sterile drape, hand hygiene and skin antiseptic. A timeout was performed prior to the initiation of the procedure. The right forearm to the wrist was prepped and draped in the usual sterile manner. The right radial artery was identified with ultrasound, and its morphology documented in the radiology PACS system. A dorsal palmar anastomosis was verified to be present. Using ultrasound guidance access into the right radial artery was obtained with a 4/5 French sheath over an 018 inch micro guidewire. The micro guidewire, and the obturator were removed. Good aspiration obtained from the side port of the radial sheath. A cocktail of 2000 units of heparin, 200 mcg of nitroglycerin, and 2.5 mg of verapamil was then infused in diluted form without event. A right radial arteriogram was performed. Over an 035 inch Roadrunner guidewire, a Simmons 2 diagnostic catheter was advanced to the aortic arch region, an arteriogram was  performed at the origin of the right vertebral artery. The [place] 2 5 [place] diagnostic catheter could not be formed due to the tortuosity of the right subclavian artery proximally. A wrist band was then applied at the right radial puncture site with a distal right radial pulse verified to be present. The right groin was prepped and draped in the usual sterile fashion. Thereafter using modified Seldinger technique, transfemoral access into the right common femoral artery was obtained without difficulty. Over an 0.035 inch guidewire, a 5 French Pinnacle sheath was inserted. Through this, and also over an 0.035 inch guidewire, a 5 [place] JB 1 catheter was advanced to the aortic arch region and selectively positioned in the right common  carotid artery, the left common carotid artery and the left vertebral artery. FINDINGS: The right vertebral artery origin is widely patent. The non dominant vertebral artery is seen to opacify to the cranial skull base. The right subclavian artery is seen to be fusiformly dilated proximally associated with approximately 75% stenosis. However, no right vertebral artery steal phenomenon is noticed at this time. The right common carotid arteriogram demonstrates the right external carotid artery and its major branches to be widely patent. The right internal carotid artery at the bulb to the cranial skull base demonstrates wide opacification. The petrous, the cavernous and the supraclinoid right ICA are widely patent, with the tapered mild-to-moderate stenosis of the proximal supraclinoid ICA just at the level of the origin of the right posterior communicating artery. The right posterior communicating artery demonstrates opacification into the right posterior cerebral artery distribution with a tapered severe stenosis in the proximal P2 segment. The right middle cerebral artery and the right anterior cerebral artery opacify into the capillary and venous phases. The right middle cerebral  artery at its origin demonstrates a well-defined focal area of absent contrast which may represent a small fenestration. The left common carotid arteriogram demonstrates the left external carotid artery and its branches to be widely patent. The left internal carotid artery at the bulb to the cranial skull base is widely patent. The petrous, the cavernous and the supraclinoid left ICA are widely patent. A left posterior communicating artery demonstrates opacification of the left distribution with a mild to moderate areas of smooth narrowing in the distal P2 region probably representing intracranial arteriosclerosis. The left middle cerebral artery and the left anterior cerebral artery opacify into the capillary and venous phases. Transient cross-filling via the anterior communicating artery of the right anterior cerebral A2 segment is seen. The left vertebral artery origin is widely patent. The vessel demonstrates moderate tortuosity just distal to this. More distally, the vessel is seen to opacify to the cranial skull base. Opacification is seen to the cranial skull base. Patency is maintained of the left vertebrobasilar junction and the left posterior-inferior cerebellar artery. Retrograde opacification of the right vertebrobasilar junction from the left vertebral artery is seen to the proximal to the right posterior-inferior cerebellar artery. The superior cerebellar arteries and the anterior-inferior cerebellar arteries demonstrate patency with focal areas of mild caliber irregularity. Mild tapered narrowing of the distal basilar artery is seen to the origin of the right posterior cerebral artery. The right posterior cerebral artery demonstrates mild stenosis at the P1 PCOM junction. Distal to this there is a severe stenosis of the right posterior cerebral artery P2 region with distal flow noted. No opacification is seen of the left posterior cerebral artery at its origin. There is a prominent artery of Percheron  supplying the thalamus. IMPRESSION: High-grade smooth stenosis of the right posterior cerebral artery P2 segment. Approximately 75% stenosis of the proximal right subclavian artery with post stenotic dilatation. Mild-to-moderate stenosis of the right posterior cerebral artery proximally. PLAN: Follow-up in the clinic to discuss the above angiographic findings. Electronically Signed   By: Julieanne Cotton M.D.   On: 09/27/2022 08:14   IR ANGIO VERTEBRAL SEL VERTEBRAL UNI L MOD SED  Result Date: 09/27/2022 CLINICAL DATA:  Intermittent left-sided visual field deficit associated with lightheadedness. Abnormal CT angiogram of the head and neck significant for severe stenosis of the right posterior cerebral artery P2 segment. EXAM: BILATERAL COMMON CAROTID AND INNOMINATE ANGIOGRAPHY COMPARISON:  CT angiogram of the head and neck of September 18, 2022. MEDICATIONS: Heparin 2000 units IV. No antibiotic was administered within 1 hour of the procedure. ANESTHESIA/SEDATION: Versed 1 mg IV; Fentanyl 25 mcg IV. Moderate Sedation Time:  89 minutes The patient was continuously monitored during the procedure by the interventional radiology nurse under my direct supervision. CONTRAST:  Omnipaque 300 70 cc. FLUOROSCOPY TIME:  Fluoroscopy Time: 17 minutes 48 seconds (800 mGy). COMPLICATIONS: None immediate. TECHNIQUE: Informed written consent was obtained from the patient after a thorough discussion of the procedural risks, benefits and alternatives. All questions were addressed. Maximal Sterile Barrier Technique was utilized including caps, mask, sterile gowns, sterile gloves, sterile drape, hand hygiene and skin antiseptic. A timeout was performed prior to the initiation of the procedure. The right forearm to the wrist was prepped and draped in the usual sterile manner. The right radial artery was identified with ultrasound, and its morphology documented in the radiology PACS system. A dorsal palmar anastomosis was verified to be  present. Using ultrasound guidance access into the right radial artery was obtained with a 4/5 French sheath over an 018 inch micro guidewire. The micro guidewire, and the obturator were removed. Good aspiration obtained from the side port of the radial sheath. A cocktail of 2000 units of heparin, 200 mcg of nitroglycerin, and 2.5 mg of verapamil was then infused in diluted form without event. A right radial arteriogram was performed. Over an 035 inch Roadrunner guidewire, a Simmons 2 diagnostic catheter was advanced to the aortic arch region, an arteriogram was performed at the origin of the right vertebral artery. The [place] 2 5 [place] diagnostic catheter could not be formed due to the tortuosity of the right subclavian artery proximally. A wrist band was then applied at the right radial puncture site with a distal right radial pulse verified to be present. The right groin was prepped and draped in the usual sterile fashion. Thereafter using modified Seldinger technique, transfemoral access into the right common femoral artery was obtained without difficulty. Over an 0.035 inch guidewire, a 5 French Pinnacle sheath was inserted. Through this, and also over an 0.035 inch guidewire, a 5 [place] JB 1 catheter was advanced to the aortic arch region and selectively positioned in the right common carotid artery, the left common carotid artery and the left vertebral artery. FINDINGS: The right vertebral artery origin is widely patent. The non dominant vertebral artery is seen to opacify to the cranial skull base. The right subclavian artery is seen to be fusiformly dilated proximally associated with approximately 75% stenosis. However, no right vertebral artery steal phenomenon is noticed at this time. The right common carotid arteriogram demonstrates the right external carotid artery and its major branches to be widely patent. The right internal carotid artery at the bulb to the cranial skull base demonstrates wide  opacification. The petrous, the cavernous and the supraclinoid right ICA are widely patent, with the tapered mild-to-moderate stenosis of the proximal supraclinoid ICA just at the level of the origin of the right posterior communicating artery. The right posterior communicating artery demonstrates opacification into the right posterior cerebral artery distribution with a tapered severe stenosis in the proximal P2 segment. The right middle cerebral artery and the right anterior cerebral artery opacify into the capillary and venous phases. The right middle cerebral artery at its origin demonstrates a well-defined focal area of absent contrast which may represent a small fenestration. The left common carotid arteriogram demonstrates the left external carotid artery and its branches to be widely patent. The left internal carotid artery  at the bulb to the cranial skull base is widely patent. The petrous, the cavernous and the supraclinoid left ICA are widely patent. A left posterior communicating artery demonstrates opacification of the left distribution with a mild to moderate areas of smooth narrowing in the distal P2 region probably representing intracranial arteriosclerosis. The left middle cerebral artery and the left anterior cerebral artery opacify into the capillary and venous phases. Transient cross-filling via the anterior communicating artery of the right anterior cerebral A2 segment is seen. The left vertebral artery origin is widely patent. The vessel demonstrates moderate tortuosity just distal to this. More distally, the vessel is seen to opacify to the cranial skull base. Opacification is seen to the cranial skull base. Patency is maintained of the left vertebrobasilar junction and the left posterior-inferior cerebellar artery. Retrograde opacification of the right vertebrobasilar junction from the left vertebral artery is seen to the proximal to the right posterior-inferior cerebellar artery. The  superior cerebellar arteries and the anterior-inferior cerebellar arteries demonstrate patency with focal areas of mild caliber irregularity. Mild tapered narrowing of the distal basilar artery is seen to the origin of the right posterior cerebral artery. The right posterior cerebral artery demonstrates mild stenosis at the P1 PCOM junction. Distal to this there is a severe stenosis of the right posterior cerebral artery P2 region with distal flow noted. No opacification is seen of the left posterior cerebral artery at its origin. There is a prominent artery of Percheron supplying the thalamus. IMPRESSION: High-grade smooth stenosis of the right posterior cerebral artery P2 segment. Approximately 75% stenosis of the proximal right subclavian artery with post stenotic dilatation. Mild-to-moderate stenosis of the right posterior cerebral artery proximally. PLAN: Follow-up in the clinic to discuss the above angiographic findings. Electronically Signed   By: Julieanne Cotton M.D.   On: 09/27/2022 08:14   IR ANGIO VERTEBRAL SEL SUBCLAVIAN INNOMINATE UNI R MOD SED  Result Date: 09/27/2022 CLINICAL DATA:  Intermittent left-sided visual field deficit associated with lightheadedness. Abnormal CT angiogram of the head and neck significant for severe stenosis of the right posterior cerebral artery P2 segment. EXAM: BILATERAL COMMON CAROTID AND INNOMINATE ANGIOGRAPHY COMPARISON:  CT angiogram of the head and neck of September 18, 2022. MEDICATIONS: Heparin 2000 units IV. No antibiotic was administered within 1 hour of the procedure. ANESTHESIA/SEDATION: Versed 1 mg IV; Fentanyl 25 mcg IV. Moderate Sedation Time:  89 minutes The patient was continuously monitored during the procedure by the interventional radiology nurse under my direct supervision. CONTRAST:  Omnipaque 300 70 cc. FLUOROSCOPY TIME:  Fluoroscopy Time: 17 minutes 48 seconds (800 mGy). COMPLICATIONS: None immediate. TECHNIQUE: Informed written consent was  obtained from the patient after a thorough discussion of the procedural risks, benefits and alternatives. All questions were addressed. Maximal Sterile Barrier Technique was utilized including caps, mask, sterile gowns, sterile gloves, sterile drape, hand hygiene and skin antiseptic. A timeout was performed prior to the initiation of the procedure. The right forearm to the wrist was prepped and draped in the usual sterile manner. The right radial artery was identified with ultrasound, and its morphology documented in the radiology PACS system. A dorsal palmar anastomosis was verified to be present. Using ultrasound guidance access into the right radial artery was obtained with a 4/5 French sheath over an 018 inch micro guidewire. The micro guidewire, and the obturator were removed. Good aspiration obtained from the side port of the radial sheath. A cocktail of 2000 units of heparin, 200 mcg of nitroglycerin, and 2.5 mg  of verapamil was then infused in diluted form without event. A right radial arteriogram was performed. Over an 035 inch Roadrunner guidewire, a [place] 2 diagnostic catheter was advanced to the aortic arch region, an arteriogram was performed at the origin of the right vertebral artery. The [place] 2 5 [place] diagnostic catheter could not be formed due to the tortuosity of the right subclavian artery proximally. A wrist band was then applied at the right radial puncture site with a distal right radial pulse verified to be present. The right groin was prepped and draped in the usual sterile fashion. Thereafter using modified Seldinger technique, transfemoral access into the right common femoral artery was obtained without difficulty. Over an 0.035 inch guidewire, a 5 French Pinnacle sheath was inserted. Through this, and also over an 0.035 inch guidewire, a 5 [place] JB 1 catheter was advanced to the aortic arch region and selectively positioned in the right common carotid artery, the left common  carotid artery and the left vertebral artery. FINDINGS: The right vertebral artery origin is widely patent. The non dominant vertebral artery is seen to opacify to the cranial skull base. The right subclavian artery is seen to be fusiformly dilated proximally associated with approximately 75% stenosis. However, no right vertebral artery steal phenomenon is noticed at this time. The right common carotid arteriogram demonstrates the right external carotid artery and its major branches to be widely patent. The right internal carotid artery at the bulb to the cranial skull base demonstrates wide opacification. The petrous, the cavernous and the supraclinoid right ICA are widely patent, with the tapered mild-to-moderate stenosis of the proximal supraclinoid ICA just at the level of the origin of the right posterior communicating artery. The right posterior communicating artery demonstrates opacification into the right posterior cerebral artery distribution with a tapered severe stenosis in the proximal P2 segment. The right middle cerebral artery and the right anterior cerebral artery opacify into the capillary and venous phases. The right middle cerebral artery at its origin demonstrates a well-defined focal area of absent contrast which may represent a small fenestration. The left common carotid arteriogram demonstrates the left external carotid artery and its branches to be widely patent. The left internal carotid artery at the bulb to the cranial skull base is widely patent. The petrous, the cavernous and the supraclinoid left ICA are widely patent. A left posterior communicating artery demonstrates opacification of the left distribution with a mild to moderate areas of smooth narrowing in the distal P2 region probably representing intracranial arteriosclerosis. The left middle cerebral artery and the left anterior cerebral artery opacify into the capillary and venous phases. Transient cross-filling via the anterior  communicating artery of the right anterior cerebral A2 segment is seen. The left vertebral artery origin is widely patent. The vessel demonstrates moderate tortuosity just distal to this. More distally, the vessel is seen to opacify to the cranial skull base. Opacification is seen to the cranial skull base. Patency is maintained of the left vertebrobasilar junction and the left posterior-inferior cerebellar artery. Retrograde opacification of the right vertebrobasilar junction from the left vertebral artery is seen to the proximal to the right posterior-inferior cerebellar artery. The superior cerebellar arteries and the anterior-inferior cerebellar arteries demonstrate patency with focal areas of mild caliber irregularity. Mild tapered narrowing of the distal basilar artery is seen to the origin of the right posterior cerebral artery. The right posterior cerebral artery demonstrates mild stenosis at the P1 PCOM junction. Distal to this there is a severe stenosis  of the right posterior cerebral artery P2 region with distal flow noted. No opacification is seen of the left posterior cerebral artery at its origin. There is a prominent artery of Percheron supplying the thalamus. IMPRESSION: High-grade smooth stenosis of the right posterior cerebral artery P2 segment. Approximately 75% stenosis of the proximal right subclavian artery with post stenotic dilatation. Mild-to-moderate stenosis of the right posterior cerebral artery proximally. PLAN: Follow-up in the clinic to discuss the above angiographic findings. Electronically Signed   By: Julieanne Cotton M.D.   On: 09/27/2022 08:14   IR Radiologist Eval & Mgmt  Result Date: 09/22/2022 EXAM: NEW PATIENT OFFICE VISIT CHIEF COMPLAINT: Intermittent loss of vision in the left eye with increased frequency. Current Pain Level: 1-10 HISTORY OF PRESENT ILLNESS: 72 year old right-handed gentleman who has been referred for evaluation of intermittent loss of vision in the  left eye with increasing frequency. The patient is accompanied by his spouse. History obtained from the patient's spouse and from patient's electronic medical records. Neuroimaging studies were also evaluated. The patient reports his initial episode of transient loss of vision lasting a few seconds in [place] approximately 2-1/2 weeks ago. He then had a second episode again lasting a few seconds less than a minute, and was associated with lightheadedness. He also experienced some generalized weakness sitting down. He denied any associated symptoms of speech difficulties, double vision, or limb paresthesias or weakness or incoordination or a preceding or a subsequent headache. The patient underwent evaluation in the emergency room at [facility] in [place]. He underwent a CT of the brain and CT angiogram which revealed stenosis of the right posterior cerebral artery. He was placed on aspirin and Plavix and then discharged. On returning home, he had another episode recently while mowing his lawn. There was sudden loss of vision in his left visual field associated with lightheadedness which prompted him to lie down. This cleared in less than a minute. He then subsequently had another episode which prompted him to come to the hospital. An MRI of the brain revealed no evidence of acute ischemia. His CT angiogram, however, did reveal a high-grade stenosis of the right posterior cerebral artery P2 segment. Since that time, the patient reports having experienced multiple similar episodes lasting a few seconds without associated diplopia, or complete loss of vision. Reports no associated nausea or vomiting or vertiginous or incoordination symptoms. He feels just generally weak before recovering with the symptoms. Patient states he has been taking aspirin and Plavix diligently. He denies any episodes of loss of awareness, or seizure-like activity. He denies any symptoms of palpitations, chest pains, or of  palpitations. Denies recent wheezing, coughing or sputum production. Denies any symptoms of difficulty eating or drinking. Denies any abdominal pains, constipation, diarrhea or of melena. No episodes of dysuria, hematuria, or polyuria. No recent chills, fever or rigors. . * : Marital status: * : Married * : * : Spouse name: * : Not on file . * : Number of children: * : Not on file . * : Years of education: * : Not on file . * : Highest education level: * : Not on file Occupational History . * : Not on file Tobacco Use . * : Smoking status: * : Never . * : Smokeless tobacco: * : Never Vaping Use . * : Vaping Use: * : Never used Substance and Sexual Activity . * : Alcohol use: * : Yes * : * : Comment: occasional . * :  Drug use: * : Never . * : Sexual activity: * : Not on file Other Topics * : Concern . * : Not on file Social History Narrative * : Caffeine occasional coffee * : Education: masters. * : Retired. From ministry. Food Insecurity: Not on file Transportation Needs: Not on file Physical Activity: Not on file Stress: Not on file Social Connections: Not on file Intimate Partner Violence: Not on file Family History Problem * : Relation * : Age of Onset . * : CVA * : Brother * : Past Medical History: Diagnosis * : Date . * : COVID * : . * : DJD (degenerative joint disease) of knee * : . * : Dyspnea on exertion * : . * : Hearing loss * : . * : Hypercholesteremia * : . * : Hypertension * : . * : Overweight * : . * : Paresthesias * : . * : Personal history of colonic polyps * : . * : Primary osteoarthritis of both knees * : . * : Skin mole * : . * : Spinal stenosis * : . * : Stenosis of right subclavian artery (HCC) * : Patient Active Problem List * : Diagnosis * : Date Noted . * : Lumbosacral radiculopathy at L5 * : 02/18/2022 Past Surgical History: Procedure * : Laterality * : Date . * : APPENDECTOMY * : * : . * : CARDIAC CATHETERIZATION * : * : . * : HERNIA REPAIR * : * : . * : KNEE SURGERY * : Right * : . * :  SHOULDER SURGERY * : Left * : Current Outpatient Medications Medication * : Sig * : Dispense * : Refill . * : aspirin EC 81 MG tablet * : Take 81 mg by mouth daily. Swallow whole. * : * : . * : gabapentin (NEURONTIN) 300 MG capsule * : Take 300 mg by mouth 3 (three) times daily. * : * : . * : hydrochlorothiazide (HYDRODIURIL) 12.5 MG tablet * : Take 12.5 mg by mouth daily. * : * : . * : losartan (COZAAR) 100 MG tablet * : Take 100 mg by mouth daily. * : * : . * : Multiple Vitamin (MULTIVITAMIN) tablet * : Take 1 tablet by mouth daily. * : * : . * : rosuvastatin (CRESTOR) 20 MG tablet * : Take 1 tablet (20 mg total) by mouth daily. Please schedule appointment for future refills. Thank you (Patient not taking: Reported on 02/18/2022) Home Medications Prior to Admission medications Medication * : Sig * : Start Date * : End Date * : Taking? * : Authorizing Provider hydrochlorothiazide (MICROZIDE) 12.5 MG capsule * : Take 1 capsule (12.5 mg total) by mouth daily. * : 08/24/22 * : * : * : Perlie Gold, PA-C losartan (COZAAR) 100 MG tablet * : Take 1 tablet (100 mg total) by mouth daily. * : 08/24/22 * : * : * Perlie Gold, PA-C Multiple Vitamin (MULTIVITAMIN) tablet * : Take 1 tablet by mouth daily. * : * : * : * : [provider] rosuvastatin (CRESTOR) 20 MG tablet * : Take 1 tablet (20 mg total) by mouth daily. * : 08/24/22 * : * : * : Perlie Gold, PA-C rosuvastatin (CRESTOR) 40 MG tablet * : Take 1 tablet (40 mg total) by mouth daily. * : 09/17/22 * : * : * : Allergies: Patient has no known allergies. REVIEW OF SYSTEMS: Negative unless as mentioned above.  PHYSICAL EXAMINATION: Appears in no acute distress. Normal eye contact. Awake, alert and oriented. Speech and comprehension intact. No gross abnormal neurological findings evident. Apparently no evidence of visual field deficit with the patient being able to identify fingers in his left visual field. Station and gait normal. ASSESSMENT AND PLAN: Patient  and spouse informed symptoms most likely related to the high-grade stenosis seen in the right posterior cerebral artery petrous segment. The symptoms probably representing crescendo TIAs despite patient taking his aspirin and Plavix. The reasons for obtaining a diagnostic catheter arteriogram is to accurately assess the degree of the stenosis of the PCA, to guide further management considerations such as endovascular revascularization. The diagnostic catheter arteriogram would be on an outpatient basis with conscious sedation. The procedure would be either via trans radial or transfemoral route. The patient would spend 3-4 hours thereafter in short-stay prior to being discharged. Will also obtain a P2Y12 in order to ascertain if the patient is a responder to Plavix. In the meantime, patient advised to continue taking his present medication and to maintain adequate hydration. Both the patient and the spouse leave with good understanding and agreement with the above management plan. Electronically Signed   By: Julieanne Cotton M.D.   On: 09/22/2022 12:29   MR BRAIN WO CONTRAST  Result Date: 09/18/2022 CLINICAL DATA:  Transient ischemic attack (TIA) EXAM: MRI HEAD WITHOUT CONTRAST TECHNIQUE: Multiplanar, multiecho pulse sequences of the brain and surrounding structures were obtained without intravenous contrast. COMPARISON:  Same day CTA. FINDINGS: Brain: No acute infarction, hemorrhage, hydrocephalus, extra-axial collection or mass lesion. Vascular: Major arterial flow voids are maintained at the skull base. Skull and upper cervical spine: Normal marrow signal. Sinuses/Orbits: Mild paranasal sinus mucosal thickening. No acute orbital findings. Other: No sizable mastoid effusions. IMPRESSION: No evidence of acute intracranial abnormality. Electronically Signed   By: Feliberto Harts M.D.   On: 09/18/2022 18:33   CT ANGIO HEAD NECK W WO CM  Result Date: 09/18/2022 CLINICAL DATA:  Neuro deficit, acute, stroke  suspected EXAM: CT ANGIOGRAPHY HEAD AND NECK WITH AND WITHOUT CONTRAST TECHNIQUE: Multidetector CT imaging of the head and neck was performed using the standard protocol during bolus administration of intravenous contrast. Multiplanar CT image reconstructions and MIPs were obtained to evaluate the vascular anatomy. Carotid stenosis measurements (when applicable) are obtained utilizing NASCET criteria, using the distal internal carotid diameter as the denominator. RADIATION DOSE REDUCTION: This exam was performed according to the departmental dose-optimization program which includes automated exposure control, adjustment of the mA and/or kV according to patient size and/or use of iterative reconstruction technique. CONTRAST:  75mL OMNIPAQUE IOHEXOL 350 MG/ML SOLN COMPARISON:  None Available. FINDINGS: CT HEAD FINDINGS Brain: No evidence of acute large vascular territory infarction, hemorrhage, hydrocephalus, extra-axial collection or mass lesion/mass effect. Vascular: See below. Skull: No acute fracture. Sinuses/Orbits: Mild paranasal sinus mucosal thickening. Review of the MIP images confirms the above findings CTA NECK FINDINGS Aortic arch: Great vessel origins are patent without significant stenosis. Right carotid system: No evidence of dissection, stenosis (50% or greater), or occlusion. Left carotid system: No evidence of dissection, stenosis (50% or greater), or occlusion. Vertebral arteries: Left dominant. No evidence of dissection, stenosis (50% or greater), or occlusion. Skeleton: No acute abnormality on limited assessment. Other neck: No acute abnormality on limited assessment. Upper chest: Visualized lung apices are clear. Review of the MIP images confirms the above findings CTA HEAD FINDINGS Anterior circulation: Bilateral intracranial ICAs, MCAs and ACAs are patent without proximal hemodynamically  significant stenosis. Posterior circulation: Bilateral intradural vertebral arteries and basilar artery are  patent without significant stenosis. Incidental small/short-segment fenestration of the basilar artery, anatomic variant. Severe stenosis of the right proximal P2 PCA. Left fetal type PCA and small vertebrobasilar system, anatomic variant Venous sinuses: As permitted by contrast timing, patent. Review of the MIP images confirms the above findings IMPRESSION: 1. No evidence of acute large vascular territory infarct or acute hemorrhage. 2. No large vessel occlusion. 3. Severe right P2 PCA stenosis. Electronically Signed   By: Feliberto Harts M.D.   On: 09/18/2022 17:23    Labs:  CBC: Recent Labs    09/18/22 1503 09/18/22 1513 09/24/22 0718 10/11/22 0613  WBC 6.0  --  4.3 4.6  HGB 14.7 13.6 14.3 14.2  HCT 40.7 40.0 40.4 40.8  PLT 183  --  153 155    COAGS: Recent Labs    09/18/22 1503 09/24/22 0718 10/11/22 0613  INR 1.1 1.0 1.0  APTT 30  --   --     BMP: Recent Labs    09/18/22 1503 09/18/22 1513 09/24/22 0718 10/11/22 0613  NA 139 142 142 141  K 3.5 3.5 3.5 3.3*  CL 102 103 106 104  CO2 26  --  26 25  GLUCOSE 100* 100* 110* 98  BUN 18 21 16 18   CALCIUM 9.3  --  9.0 9.2  CREATININE 1.13 1.10 1.07 1.06  GFRNONAA >60  --  >60 >60    LIVER FUNCTION TESTS: Recent Labs    09/18/22 1503  BILITOT 0.6  AST 33  ALT 34  ALKPHOS 68  PROT 6.7  ALBUMIN 4.3    TUMOR MARKERS: No results for input(s): "AFPTM", "CEA", "CA199", "CHROMGRNA" in the last 8760 hours.  Assessment and Plan:  72 y.o. male outpatient. History of L5 radiculopathy, HTN, HLD, OA. Recently diagnosed with right PCA stenosis and stenosis of the right sub clavian vein at OSH when he presented with left sided hemianopia that self resolved. On September 18, 2022 Mr, [name] presented to the emergency department [facility] on September 18, 2022 with an onset of left sided hemianopia. Patient described the episode as self resolving with rest. Patient presented to the emergency room when a second episode occurred later  that evening. The second episode again, self resolved. CT Angio head, neck, width, and without contrast from September 18, 2022 reads. Fear right sided P2 PCA stenosis. Patient and his wife were seen in the Neuro interventional radiology clinic by Doctor Dr. Julieanne Cotton on September 22, 2022. There are a detailed discussion regarding the patient's condition as well as treatment options were discussed.  The Patient and his wife agreed to a Cerebral angiogram. This was performed on 4.19.24 by Dr. Julieanne Cotton. Patient was found a High-grade smooth stenosis of the right posterior cerebral artery P2 Segment at that time. Patient presents for percutaneous transluminal coronary angioplasty with stenting with general anesthesia.   Patient is on plavix and 81 mg of ASA. NKDA. Patient has been NPO since midnight.    Risks and benefits of cerebral arteriogram with intervention were discussed with the patient  and his wife including, but not limited to bleeding, infection, vascular injury, contrast induced renal failure, stroke, reperfusion hemorrhage, or even death. This interventional procedure involves the use of X-rays and because of the nature of the planned procedure, it is possible that we will have prolonged use of X-ray fluoroscopy. Potential radiation risks to you include (but are not limited to) the  following: - A slightly elevated risk for cancer  several years later in life. This risk is typically less than 0.5% percent. This risk is low in comparison to the normal incidence of human cancer, which is 33% for women and 50% for men according to the [employer]. - Radiation induced injury can include skin redness, resembling a rash, tissue breakdown / ulcers and hair loss (which can be temporary or permanent).  The likelihood of either of these occurring depends on the difficulty of the procedure and whether you are sensitive to radiation due to previous procedures, disease, or genetic  conditions.  IF your procedure requires a prolonged use of radiation, you will be notified and given written instructions for further action.  It is your responsibility to monitor the irradiated area for the 2 weeks following the procedure and to notify your physician if you are concerned that you have suffered a radiation induced injury.   All of the patient's questions were answered, patient is agreeable to proceed. Consent signed and in chart.   Thank you for this interesting consult.  I greatly enjoyed meeting [name] and look forward to participating in their care.  A copy of this report was sent to the requesting provider on this date.  Electronically Signed: Alene Mires, NP 10/11/2022, 7:36 AM   I spent a total of 40 Minutes    in face to face in clinical consultation, greater than 50% of which was counseling/coordinating care for cerebral angiogram with intervention

## 2022-10-11 ENCOUNTER — Encounter (HOSPITAL_COMMUNITY): Payer: Self-pay | Admitting: Interventional Radiology

## 2022-10-11 ENCOUNTER — Inpatient Hospital Stay (HOSPITAL_COMMUNITY)
Admission: RE | Admit: 2022-10-11 | Discharge: 2022-10-12 | DRG: 069 | Disposition: A | Discharge status: Home / Self Care | Payer: HMO | Attending: Interventional Radiology | Admitting: Interventional Radiology

## 2022-10-11 ENCOUNTER — Other Ambulatory Visit: Payer: Self-pay

## 2022-10-11 ENCOUNTER — Inpatient Hospital Stay (HOSPITAL_COMMUNITY): Payer: HMO | Admitting: Physician Assistant

## 2022-10-11 ENCOUNTER — Inpatient Hospital Stay (HOSPITAL_COMMUNITY)
Admission: RE | Admit: 2022-10-11 | Discharge: 2022-10-11 | Disposition: A | Discharge status: Home / Self Care | Payer: HMO | Source: Ambulatory Visit | Attending: Interventional Radiology | Admitting: Interventional Radiology

## 2022-10-11 ENCOUNTER — Encounter (HOSPITAL_COMMUNITY)
Admission: RE | Disposition: A | Discharge status: Home / Self Care | Payer: Self-pay | Source: Home / Self Care | Attending: Interventional Radiology

## 2022-10-11 DIAGNOSIS — Z4682 Encounter for fitting and adjustment of non-vascular catheter: Secondary | ICD-10-CM | POA: Diagnosis not present

## 2022-10-11 DIAGNOSIS — Z9049 Acquired absence of other specified parts of digestive tract: Secondary | ICD-10-CM | POA: Diagnosis not present

## 2022-10-11 DIAGNOSIS — Z823 Family history of stroke: Secondary | ICD-10-CM | POA: Diagnosis not present

## 2022-10-11 DIAGNOSIS — I6621 Occlusion and stenosis of right posterior cerebral artery: Secondary | ICD-10-CM | POA: Diagnosis present

## 2022-10-11 DIAGNOSIS — Z538 Procedure and treatment not carried out for other reasons: Secondary | ICD-10-CM | POA: Diagnosis not present

## 2022-10-11 DIAGNOSIS — I10 Essential (primary) hypertension: Secondary | ICD-10-CM | POA: Diagnosis not present

## 2022-10-11 DIAGNOSIS — Z955 Presence of coronary angioplasty implant and graft: Secondary | ICD-10-CM

## 2022-10-11 DIAGNOSIS — I2510 Atherosclerotic heart disease of native coronary artery without angina pectoris: Secondary | ICD-10-CM

## 2022-10-11 DIAGNOSIS — Z79899 Other long term (current) drug therapy: Secondary | ICD-10-CM | POA: Diagnosis not present

## 2022-10-11 DIAGNOSIS — Z7902 Long term (current) use of antithrombotics/antiplatelets: Secondary | ICD-10-CM | POA: Diagnosis not present

## 2022-10-11 DIAGNOSIS — M170 Bilateral primary osteoarthritis of knee: Secondary | ICD-10-CM | POA: Diagnosis present

## 2022-10-11 DIAGNOSIS — M5417 Radiculopathy, lumbosacral region: Secondary | ICD-10-CM | POA: Diagnosis not present

## 2022-10-11 DIAGNOSIS — E7800 Pure hypercholesterolemia, unspecified: Secondary | ICD-10-CM | POA: Diagnosis not present

## 2022-10-11 DIAGNOSIS — I771 Stricture of artery: Secondary | ICD-10-CM

## 2022-10-11 DIAGNOSIS — G450 Vertebro-basilar artery syndrome: Secondary | ICD-10-CM | POA: Diagnosis not present

## 2022-10-11 DIAGNOSIS — Z86010 Personal history of colonic polyps: Secondary | ICD-10-CM | POA: Diagnosis not present

## 2022-10-11 DIAGNOSIS — H9190 Unspecified hearing loss, unspecified ear: Secondary | ICD-10-CM | POA: Diagnosis present

## 2022-10-11 DIAGNOSIS — Z7982 Long term (current) use of aspirin: Secondary | ICD-10-CM | POA: Diagnosis not present

## 2022-10-11 HISTORY — PX: IR CT HEAD LTD: IMG2386

## 2022-10-11 HISTORY — PX: RADIOLOGY WITH ANESTHESIA: SHX6223

## 2022-10-11 HISTORY — PX: IR INTRA CRAN STENT: IMG2345

## 2022-10-11 HISTORY — PX: IR ANGIO VERTEBRAL SEL VERTEBRAL UNI L MOD SED: IMG5367

## 2022-10-11 LAB — CBC WITH DIFFERENTIAL/PLATELET
Abs Immature Granulocytes: 0.01 10*3/uL (ref 0.00–0.07)
Basophils Absolute: 0.1 10*3/uL (ref 0.0–0.1)
Basophils Relative: 1 %
Eosinophils Absolute: 0.2 10*3/uL (ref 0.0–0.5)
Eosinophils Relative: 4 %
HCT: 40.8 % (ref 39.0–52.0)
Hemoglobin: 14.2 g/dL (ref 13.0–17.0)
Immature Granulocytes: 0 %
Lymphocytes Relative: 28 %
Lymphs Abs: 1.3 10*3/uL (ref 0.7–4.0)
MCH: 31.1 pg (ref 26.0–34.0)
MCHC: 34.8 g/dL (ref 30.0–36.0)
MCV: 89.5 fL (ref 80.0–100.0)
Monocytes Absolute: 0.6 10*3/uL (ref 0.1–1.0)
Monocytes Relative: 12 %
Neutro Abs: 2.5 10*3/uL (ref 1.7–7.7)
Neutrophils Relative %: 55 %
Platelets: 155 10*3/uL (ref 150–400)
RBC: 4.56 MIL/uL (ref 4.22–5.81)
RDW: 13.1 % (ref 11.5–15.5)
WBC: 4.6 10*3/uL (ref 4.0–10.5)
nRBC: 0 % (ref 0.0–0.2)

## 2022-10-11 LAB — BASIC METABOLIC PANEL
Anion gap: 12 (ref 5–15)
BUN: 18 mg/dL (ref 8–23)
CO2: 25 mmol/L (ref 22–32)
Calcium: 9.2 mg/dL (ref 8.9–10.3)
Chloride: 104 mmol/L (ref 98–111)
Creatinine, Ser: 1.06 mg/dL (ref 0.61–1.24)
GFR, Estimated: >60 mL/min (ref >60)
Glucose, Bld: 98 mg/dL (ref 70–99)
Potassium: 3.3 mmol/L — ABNORMAL LOW (ref 3.5–5.1)
Sodium: 141 mmol/L (ref 135–145)

## 2022-10-11 LAB — MRSA NEXT GEN BY PCR, NASAL: MRSA by PCR Next Gen: NOT DETECTED

## 2022-10-11 LAB — PROTIME-INR
INR: 1 (ref 0.8–1.2)
Prothrombin Time: 13.6 seconds (ref 11.4–15.2)

## 2022-10-11 LAB — POCT ACTIVATED CLOTTING TIME: Activated Clotting Time: 179 seconds

## 2022-10-11 SURGERY — IR WITH ANESTHESIA
Anesthesia: General

## 2022-10-11 MED ORDER — PROPOFOL 10 MG/ML IV BOLUS
INTRAVENOUS | Status: DC | PRN
  Administered 2022-10-11: 50 mg via INTRAVENOUS

## 2022-10-11 MED ORDER — ACETAMINOPHEN 10 MG/ML IV SOLN: 1000.0000 mg | Freq: Once | INTRAVENOUS | Status: DC | PRN

## 2022-10-11 MED ORDER — SODIUM CHLORIDE 0.9 % IV SOLN: INTRAVENOUS | Status: DC

## 2022-10-11 MED ORDER — ASPIRIN 81 MG PO CHEW
81.0000 mg | CHEWABLE_TABLET | Freq: Every day | ORAL | Status: DC
  Administered 2022-10-12: 81 mg via ORAL

## 2022-10-11 MED ORDER — PHENYLEPHRINE HCL-NACL 20-0.9 MG/250ML-% IV SOLN
INTRAVENOUS | Status: DC | PRN
  Administered 2022-10-11: 40 ug/min via INTRAVENOUS

## 2022-10-11 MED ORDER — OXYCODONE HCL 5 MG PO TABS: 5.0000 mg | ORAL_TABLET | Freq: Once | ORAL | Status: DC | PRN

## 2022-10-11 MED ORDER — CLEVIDIPINE BUTYRATE 0.5 MG/ML IV EMUL
0.0000 mg/h | INTRAVENOUS | Status: DC
  Administered 2022-10-11: 19 mg/h via INTRAVENOUS
  Administered 2022-10-11: 21 mg/h via INTRAVENOUS
  Administered 2022-10-11: 16 mg/h via INTRAVENOUS
  Administered 2022-10-11: 19 mg/h via INTRAVENOUS
  Administered 2022-10-11: 2 mg/h via INTRAVENOUS
  Administered 2022-10-11: 8 mg/h via INTRAVENOUS
  Administered 2022-10-11: 12 mg/h via INTRAVENOUS
  Filled 2022-10-11 (×4): qty 50
  Filled 2022-10-11: qty 100
  Filled 2022-10-11: qty 50
  Filled 2022-10-11: qty 100
  Filled 2022-10-11: qty 50

## 2022-10-11 MED ORDER — MIDAZOLAM HCL 2 MG/2ML IJ SOLN
INTRAMUSCULAR | Status: DC | PRN
  Administered 2022-10-11: 2 mg via INTRAVENOUS

## 2022-10-11 MED ORDER — SUGAMMADEX SODIUM 200 MG/2ML IV SOLN
INTRAVENOUS | Status: DC | PRN
  Administered 2022-10-11: 200 mg via INTRAVENOUS

## 2022-10-11 MED ORDER — PHENYLEPHRINE HCL (PRESSORS) 10 MG/ML IV SOLN
INTRAVENOUS | Status: DC | PRN
  Administered 2022-10-11: 160 ug via INTRAVENOUS

## 2022-10-11 MED ORDER — PHENYLEPHRINE 80 MCG/ML (10ML) SYRINGE FOR IV PUSH (FOR BLOOD PRESSURE SUPPORT)
PREFILLED_SYRINGE | INTRAVENOUS | Status: DC | PRN
  Administered 2022-10-11 (×3): 40 ug via INTRAVENOUS
  Administered 2022-10-11: 160 ug via INTRAVENOUS
  Administered 2022-10-11: 80 ug via INTRAVENOUS

## 2022-10-11 MED ORDER — CHLORHEXIDINE GLUCONATE CLOTH 2 % EX PADS
6.0000 | MEDICATED_PAD | Freq: Every day | CUTANEOUS | Status: DC
  Administered 2022-10-12: 6 via TOPICAL

## 2022-10-11 MED ORDER — CLEVIDIPINE BUTYRATE 0.5 MG/ML IV EMUL
INTRAVENOUS | Status: AC
  Filled 2022-10-11: qty 50

## 2022-10-11 MED ORDER — NIMODIPINE 30 MG PO CAPS: 0.0000 mg | ORAL_CAPSULE | ORAL | Status: DC

## 2022-10-11 MED ORDER — NITROGLYCERIN 1 MG/10 ML FOR IR/CATH LAB
INTRA_ARTERIAL | Status: AC
  Filled 2022-10-11: qty 10

## 2022-10-11 MED ORDER — CHLORHEXIDINE GLUCONATE 0.12 % MT SOLN
OROMUCOSAL | Status: AC
  Administered 2022-10-11: 15 mL via OROMUCOSAL
  Filled 2022-10-11: qty 15

## 2022-10-11 MED ORDER — CHLORHEXIDINE GLUCONATE 0.12 % MT SOLN: 15.0000 mL | Freq: Once | OROMUCOSAL | Status: AC

## 2022-10-11 MED ORDER — DEXAMETHASONE SODIUM PHOSPHATE 10 MG/ML IJ SOLN
INTRAMUSCULAR | Status: DC | PRN
  Administered 2022-10-11: 10 mg via INTRAVENOUS

## 2022-10-11 MED ORDER — FENTANYL CITRATE (PF) 250 MCG/5ML IJ SOLN
INTRAMUSCULAR | Status: DC | PRN
  Administered 2022-10-11: 100 ug via INTRAVENOUS

## 2022-10-11 MED ORDER — CLOPIDOGREL BISULFATE 75 MG PO TABS
75.0000 mg | ORAL_TABLET | Freq: Every day | ORAL | Status: DC
  Filled 2022-10-11: qty 1

## 2022-10-11 MED ORDER — AMISULPRIDE (ANTIEMETIC) 5 MG/2ML IV SOLN: 10.0000 mg | Freq: Once | INTRAVENOUS | Status: DC | PRN

## 2022-10-11 MED ORDER — ORAL CARE MOUTH RINSE: 15.0000 mL | Freq: Once | OROMUCOSAL | Status: AC

## 2022-10-11 MED ORDER — FENTANYL CITRATE (PF) 100 MCG/2ML IJ SOLN: 25.0000 ug | INTRAMUSCULAR | Status: DC | PRN

## 2022-10-11 MED ORDER — LIDOCAINE HCL 1 % IJ SOLN
INTRAMUSCULAR | Status: AC
  Filled 2022-10-11: qty 20

## 2022-10-11 MED ORDER — LACTATED RINGERS IV SOLN: INTRAVENOUS | Status: DC

## 2022-10-11 MED ORDER — ASPIRIN 81 MG PO TBEC: 81.0000 mg | DELAYED_RELEASE_TABLET | Freq: Every day | ORAL | Status: DC

## 2022-10-11 MED ORDER — ONDANSETRON HCL 4 MG/2ML IJ SOLN
INTRAMUSCULAR | Status: DC | PRN
  Administered 2022-10-11: 4 mg via INTRAVENOUS

## 2022-10-11 MED ORDER — LIDOCAINE 2% (20 MG/ML) 5 ML SYRINGE
INTRAMUSCULAR | Status: DC | PRN
  Administered 2022-10-11: 100 mg via INTRAVENOUS

## 2022-10-11 MED ORDER — HEPARIN SODIUM (PORCINE) 1000 UNIT/ML IJ SOLN
INTRAMUSCULAR | Status: DC | PRN
  Administered 2022-10-11: 3000 [IU] via INTRAVENOUS
  Administered 2022-10-11: 500 [IU] via INTRAVENOUS
  Administered 2022-10-11: 1000 [IU] via INTRAVENOUS

## 2022-10-11 MED ORDER — ASPIRIN 81 MG PO CHEW
81.0000 mg | CHEWABLE_TABLET | Freq: Every day | ORAL | Status: DC
  Filled 2022-10-11: qty 1

## 2022-10-11 MED ORDER — EPHEDRINE SULFATE-NACL 50-0.9 MG/10ML-% IV SOSY
PREFILLED_SYRINGE | INTRAVENOUS | Status: DC | PRN
  Administered 2022-10-11: 5 mg via INTRAVENOUS
  Administered 2022-10-11: 10 mg via INTRAVENOUS

## 2022-10-11 MED ORDER — ROCURONIUM BROMIDE 10 MG/ML (PF) SYRINGE
PREFILLED_SYRINGE | INTRAVENOUS | Status: DC | PRN
  Administered 2022-10-11: 10 mg via INTRAVENOUS
  Administered 2022-10-11: 60 mg via INTRAVENOUS
  Administered 2022-10-11: 10 mg via INTRAVENOUS

## 2022-10-11 MED ORDER — OXYCODONE HCL 5 MG/5ML PO SOLN: 5.0000 mg | Freq: Once | ORAL | Status: DC | PRN

## 2022-10-11 MED ORDER — CLOPIDOGREL BISULFATE 75 MG PO TABS: 75.0000 mg | ORAL_TABLET | Freq: Every day | ORAL | Status: DC

## 2022-10-11 MED ORDER — CLOPIDOGREL BISULFATE 75 MG PO TABS: 75.0000 mg | ORAL_TABLET | ORAL | Status: DC

## 2022-10-11 MED ORDER — IOHEXOL 300 MG/ML  SOLN
150.0000 mL | Freq: Once | INTRAMUSCULAR | Status: AC | PRN
  Administered 2022-10-11: 150 mL via INTRA_ARTERIAL

## 2022-10-11 MED ORDER — CEFAZOLIN SODIUM-DEXTROSE 2-4 GM/100ML-% IV SOLN
2.0000 g | INTRAVENOUS | Status: AC
  Administered 2022-10-11: 2 g via INTRAVENOUS
  Filled 2022-10-11: qty 100

## 2022-10-11 NOTE — Sedation Documentation (Signed)
8 fr Angoseal deployed at [employer]

## 2022-10-11 NOTE — Progress Notes (Signed)
Brief Progress Note Patient seen with Dr Corliss Skains following attempted stent placement of right posterior cerebral artery. Unfortunately stent was unable to be placed today due to distal nature of the lesion. Patient is doing well post-procedure, alert and oriented x3. He has equal strength in all extremities and reports no visual deficits. CN II-XII grossly intact. Patient was informed of unsuccessful procedure and responded with a stoic nature. Patient was offered compassion and given appropriate room to explore his feelings before deciding on a future course of action. IR will continue to follow. Kennieth Francois, PA-C 10/11/2022 4:02 PM

## 2022-10-11 NOTE — Anesthesia Postprocedure Evaluation (Signed)
Anesthesia Post Note  Patient: [name]  Procedure(s) Performed: RPCA angioplasty with stenting     Patient location during evaluation: PACU Anesthesia Type: General Level of consciousness: awake Pain management: pain level controlled Vital Signs Assessment: post-procedure vital signs reviewed and stable Respiratory status: spontaneous breathing, nonlabored ventilation and respiratory function stable Cardiovascular status: blood pressure returned to baseline and stable Postop Assessment: no apparent nausea or vomiting Anesthetic complications: no   No notable events documented.  Last Vitals:  Vitals:   10/11/22 1400 10/11/22 1415  BP: 102/67 102/65  Pulse: 73 77  Resp: 16 18  Temp:    SpO2: 98% 99%    Last Pain:  Vitals:   10/11/22 1245  TempSrc:   PainSc: 0-No pain    LLE Motor Response: Purposeful movement (10/11/22 1415) LLE Sensation: Full sensation (10/11/22 1415) RLE Motor Response: Purposeful movement (10/11/22 1415) RLE Sensation: Full sensation (10/11/22 1415)      Linton Rump

## 2022-10-11 NOTE — Anesthesia Procedure Notes (Signed)
Procedure Name: Intubation Date/Time: 10/11/2022 8:47 AM  Performed by: Cheree Ditto, CRNAPre-anesthesia Checklist: Patient identified, Emergency Drugs available, Suction available and Patient being monitored Patient Re-evaluated:Patient Re-evaluated prior to induction Oxygen Delivery Method: Circle system utilized Preoxygenation: Pre-oxygenation with 100% oxygen Induction Type: IV induction Ventilation: Mask ventilation without difficulty Laryngoscope Size: Mac and 3 Grade View: Grade I Tube type: Oral Number of attempts: 1 Airway Equipment and Method: Stylet and Oral airway Placement Confirmation: ETT inserted through vocal cords under direct vision, positive ETCO2 and breath sounds checked- equal and bilateral Secured at: 23 cm Tube secured with: Tape Dental Injury: Teeth and Oropharynx as per pre-operative assessment

## 2022-10-11 NOTE — Anesthesia Procedure Notes (Signed)
Arterial Line Insertion Start/End5/11/2022 7:50 AM, 10/11/2022 7:55 AM Performed by: Cheree Ditto, CRNA, CRNA  Patient location: Pre-op. Preanesthetic checklist: patient identified, IV checked, site marked, risks and benefits discussed, surgical consent, monitors and equipment checked, pre-op evaluation, timeout performed and anesthesia consent Lidocaine 1% used for infiltration Left, radial was placed Catheter size: 20 G Hand hygiene performed , maximum sterile barriers used  and Seldinger technique used Allen's test indicative of satisfactory collateral circulation Attempts: 1 Procedure performed without using ultrasound guided technique. Following insertion, Biopatch and dressing applied. Post procedure assessment: normal  Patient tolerated the procedure well with no immediate complications.

## 2022-10-11 NOTE — Sedation Documentation (Signed)
Handoff given with PACU RN.  Right groin site level 0, dressing is quick clot and tegaderm. Dressing is clean/dry/intact. Pulses intact.

## 2022-10-11 NOTE — Progress Notes (Signed)
Pt arrived to 4N 17 from PACU. Assessments per flowsheet. Dr. Corliss Skains at bedside and orders clarified re: [facility] and bedrest. Per Deveshwar, pt can have HOB 30 at 1630 and must remain on BR until morning. Pt can have diet as tolerated.

## 2022-10-11 NOTE — Procedures (Signed)
INR.  Status post right common carotid arteriogram and left vertebral artery arteriogram.  Right CFA approach.  Findings.    Severe stenosis of the right posterior cerebral artery mid to distal  P 2 segment.  Revascularization of the stenotic lesion unsuccessful due to the distal location of the lesion via the right posterior communicating artery approach, and the left vertebral artery approach.  8 French Angio-Seal closure device deployed of the right groin puncture site.  Distal pulses all dopplerable.  Post CT brain no evidence of hemorrhagic complications.  Patient extubated.  Following recovery following simple commands appropriately and moving all fours spontaneously and  to instructions.  Pupils 3 to 4 mm round regular reactive spontaneously.  Tongue midline.  No facial asymmetry.  Fatima Sanger MD

## 2022-10-11 NOTE — Transfer of Care (Signed)
Immediate Anesthesia Transfer of Care Note  Patient: [name]  Procedure(s) Performed: RPCA angioplasty with stenting  Patient Location: PACU  Anesthesia Type:General  Level of Consciousness: awake, alert , and oriented  Airway & Oxygen Therapy: Patient Spontanous Breathing and Patient connected to nasal cannula oxygen  Post-op Assessment: Report given to RN and Post -op Vital signs reviewed and stable MAE X4  Post vital signs: Reviewed and stable  Last Vitals:  Vitals Value Taken Time  BP 132/78 10/11/22 1145  Temp    Pulse 66 10/11/22 1147  Resp 9 10/11/22 1147  SpO2 99 % 10/11/22 1147  Vitals shown include unvalidated device data.  Last Pain:  Vitals:   10/11/22 0657  TempSrc:   PainSc: 0-No pain         Complications: No notable events documented.

## 2022-10-12 ENCOUNTER — Encounter (HOSPITAL_COMMUNITY): Payer: Self-pay | Admitting: Interventional Radiology

## 2022-10-12 LAB — CBC WITH DIFFERENTIAL/PLATELET
Abs Immature Granulocytes: 0.01 10*3/uL (ref 0.00–0.07)
Basophils Absolute: 0 10*3/uL (ref 0.0–0.1)
Basophils Relative: 0 %
Eosinophils Absolute: 0 10*3/uL (ref 0.0–0.5)
Eosinophils Relative: 0 %
HCT: 34.9 % — ABNORMAL LOW (ref 39.0–52.0)
Hemoglobin: 12.5 g/dL — ABNORMAL LOW (ref 13.0–17.0)
Immature Granulocytes: 0 %
Lymphocytes Relative: 12 %
Lymphs Abs: 1 10*3/uL (ref 0.7–4.0)
MCH: 31.7 pg (ref 26.0–34.0)
MCHC: 35.8 g/dL (ref 30.0–36.0)
MCV: 88.6 fL (ref 80.0–100.0)
Monocytes Absolute: 0.8 10*3/uL (ref 0.1–1.0)
Monocytes Relative: 10 %
Neutro Abs: 6.6 10*3/uL (ref 1.7–7.7)
Neutrophils Relative %: 78 %
Platelets: 164 10*3/uL (ref 150–400)
RBC: 3.94 MIL/uL — ABNORMAL LOW (ref 4.22–5.81)
RDW: 13 % (ref 11.5–15.5)
WBC: 8.4 10*3/uL (ref 4.0–10.5)
nRBC: 0 % (ref 0.0–0.2)

## 2022-10-12 LAB — POCT I-STAT 7, (LYTES, BLD GAS, ICA,H+H)
Acid-base deficit: 8 mmol/L — ABNORMAL HIGH (ref 0.0–2.0)
Bicarbonate: 18.7 mmol/L — ABNORMAL LOW (ref 20.0–28.0)
Calcium, Ion: 1.1 mmol/L — ABNORMAL LOW (ref 1.15–1.40)
HCT: 25 % — ABNORMAL LOW (ref 39.0–52.0)
Hemoglobin: 8.5 g/dL — ABNORMAL LOW (ref 13.0–17.0)
O2 Saturation: 100 %
Potassium: 4.2 mmol/L (ref 3.5–5.1)
Sodium: 136 mmol/L (ref 135–145)
TCO2: 20 mmol/L — ABNORMAL LOW (ref 22–32)
pCO2 arterial: 40.9 mmHg (ref 32–48)
pH, Arterial: 7.268 — ABNORMAL LOW (ref 7.35–7.45)
pO2, Arterial: 299 mmHg — ABNORMAL HIGH (ref 83–108)

## 2022-10-12 LAB — BASIC METABOLIC PANEL
Anion gap: 11 (ref 5–15)
BUN: 22 mg/dL (ref 8–23)
CO2: 24 mmol/L (ref 22–32)
Calcium: 8.5 mg/dL — ABNORMAL LOW (ref 8.9–10.3)
Chloride: 101 mmol/L (ref 98–111)
Creatinine, Ser: 1.24 mg/dL (ref 0.61–1.24)
GFR, Estimated: >60 mL/min (ref >60)
Glucose, Bld: 128 mg/dL — ABNORMAL HIGH (ref 70–99)
Potassium: 3 mmol/L — ABNORMAL LOW (ref 3.5–5.1)
Sodium: 136 mmol/L (ref 135–145)

## 2022-10-12 MED ORDER — HYDROCHLOROTHIAZIDE 12.5 MG PO CAPS
12.5000 mg | ORAL_CAPSULE | Freq: Every day | ORAL | Status: DC
  Filled 2022-10-12: qty 1

## 2022-10-12 MED ORDER — CLOPIDOGREL BISULFATE 75 MG PO TABS
75.0000 mg | ORAL_TABLET | Freq: Every day | ORAL | Status: DC
  Administered 2022-10-12: 75 mg via ORAL

## 2022-10-12 MED ORDER — HYDROCHLOROTHIAZIDE 12.5 MG PO TABS
12.5000 mg | ORAL_TABLET | Freq: Every day | ORAL | Status: DC
  Administered 2022-10-12: 12.5 mg via ORAL
  Filled 2022-10-12: qty 1

## 2022-10-12 MED ORDER — ROSUVASTATIN CALCIUM 20 MG PO TABS
40.0000 mg | ORAL_TABLET | Freq: Every day | ORAL | Status: DC
  Administered 2022-10-12: 40 mg via ORAL
  Filled 2022-10-12: qty 2

## 2022-10-12 MED ORDER — ASPIRIN 81 MG PO TBEC: 81.0000 mg | DELAYED_RELEASE_TABLET | Freq: Every day | ORAL | Status: DC

## 2022-10-12 MED ORDER — POTASSIUM CHLORIDE CRYS ER 20 MEQ PO TBCR
40.0000 meq | EXTENDED_RELEASE_TABLET | Freq: Once | ORAL | Status: AC
  Administered 2022-10-12: 40 meq via ORAL
  Filled 2022-10-12: qty 2

## 2022-10-12 MED ORDER — LOSARTAN POTASSIUM 50 MG PO TABS
100.0000 mg | ORAL_TABLET | Freq: Every day | ORAL | Status: DC
  Administered 2022-10-12: 100 mg via ORAL
  Filled 2022-10-12: qty 2

## 2022-10-12 NOTE — Discharge Summary (Signed)
Patient ID: [name] MRN: [id number] DOB/AGE: [birth year] 72 y.o.  Admit date: 10/11/2022 Discharge date: 10/12/2022  Supervising Physician: Julieanne Cotton  Patient Status: [facility] - In-pt  Admission Diagnoses: Severe stenosis of right PCA   Discharge Diagnoses:  Principal Problem:   VBI (vertebrobasilar insufficiency)   Discharged Condition: good  Hospital Course: [name] is a 72 y.o. male outpatient. History of L5 radiculopathy, HTN, HLD, OA. 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 [name] presented to the emergency department 09/18/22 with sudden onset left sided hemianopia. Patient described the episode as self resolving with rest. Patient presented to the emergency room when a second episode occurred later that evening. The second episode again, self resolved. CTA head/neck contrast 4/13824 demonstrated severe P2 PCA stenosis. Patient and his wife consulted with Dr. Julieanne Cotton 09/22/22. There was a detailed discussion regarding the patient's condition as well as treatment options were discussed.The patient and his wife agreed to a cerebral angiogram. This was performed on 09/24/22 by Dr. Corliss Skains. Patient was found a high-grade smooth stenosis of the right posterior cerebral artery P2 segment at that time. Patient presented for percutaneous transluminal coronary angioplasty with stenting with general anesthesia 10/11/22. Revascularization of the stenotic lesion unsuccessful due to the distal location of the lesion via the right posterior communicating artery approach, and the left vertebral artery approach.   Significant Diagnostic Studies: radiology: NIR  Treatments: IV hydration, antibiotics: Ancef, anticoagulation: ASA and Plavix, and surgery: Cerebral angiogram with intervention under [facility]  Discharge Exam: Blood pressure (!) 88/62,  pulse 75, temperature 97.9 F (36.6 C), temperature source Oral, resp. rate 18, height 5\' 10"  (1.778 m), weight 199 lb 12.8 oz (90.6 kg), SpO2 91 %.  Physical Exam Vitals reviewed.  Constitutional:      General: He is not in acute distress.    Appearance: Normal appearance. He is not ill-appearing.  HENT:     Head: Normocephalic and atraumatic.  Eyes:     Extraocular Movements: Extraocular movements intact.     Pupils: Pupils are equal, round, and reactive to light.  Cardiovascular:     Rate and Rhythm: Normal rate and regular rhythm.     Pulses: Normal pulses.     Heart sounds: Normal heart sounds.  Pulmonary:     Effort: Pulmonary effort is normal. No respiratory distress.     Breath sounds: Normal breath sounds.  Skin:    General: Skin is warm and dry.     Comments: R CFA access site is soft with no active bleeding and no appreciable pseudoaneurysm. Dressing is C/D/I    Neurological:     Mental Status: He is alert and oriented to person, place, and time.  Psychiatric:        Mood and Affect: Mood normal.        Behavior: Behavior normal.        Thought Content: Thought content normal.        Judgment: Judgment normal.      Disposition: Discharge disposition: 01-Home or Self Care       Discharge Instructions     Call MD for:  difficulty breathing, headache or visual disturbances   Complete by: As directed    Call MD for:  extreme fatigue   Complete by: As directed    Call MD for:  hives   Complete by: As  directed    Call MD for:  persistant dizziness or light-headedness   Complete by: As directed    Call MD for:  persistant nausea and vomiting   Complete by: As directed    Call MD for:  redness, tenderness, or signs of infection (pain, swelling, redness, odor or green/yellow discharge around incision site)   Complete by: As directed    Call MD for:  severe uncontrolled pain   Complete by: As directed    Call MD for:  temperature >100.4   Complete by: As  directed    Diet - low sodium heart healthy   Complete by: As directed    Discharge instructions   Complete by: As directed    No driving, stooping, bending, climbing stairs or lifting more that 10 lbs for 2 days.   Driving Restrictions   Complete by: As directed    No driving for 2 days   Increase activity slowly   Complete by: As directed    Lifting restrictions   Complete by: As directed    No driving, bending, stooping, climbing stairs or lifting more than 10 lbs for 2 days      Allergies as of 10/12/2022   No Known Allergies      Medication List     TAKE these medications    aspirin EC 81 MG tablet Take 81 mg by mouth daily. Swallow whole.   clopidogrel 75 MG tablet Commonly known as: PLAVIX Take 75 mg by mouth daily.   Coenzyme Q10 200 MG capsule Take 200 mg by mouth daily.   hydrochlorothiazide 12.5 MG capsule Commonly known as: MICROZIDE Take 1 capsule (12.5 mg total) by mouth daily.   losartan 100 MG tablet Commonly known as: COZAAR Take 1 tablet (100 mg total) by mouth daily.   multivitamin tablet Take 1 tablet by mouth daily.   rosuvastatin 20 MG tablet Commonly known as: CRESTOR Take 1 tablet (20 mg total) by mouth daily.   rosuvastatin 40 MG tablet Commonly known as: CRESTOR Take 1 tablet (40 mg total) by mouth daily.   sildenafil 50 MG tablet Commonly known as: VIAGRA Take 50 mg by mouth daily as needed for erectile dysfunction.   TURMERIC PO Take 1,500 mg by mouth daily.          Electronically Signed: Shon Hough, NP 10/12/2022, 9:15 AM   I have spent Greater Than 30 Minutes discharging Stevenson Clinch.

## 2022-10-19 ENCOUNTER — Encounter: Payer: Self-pay | Admitting: Neurology

## 2022-10-19 ENCOUNTER — Other Ambulatory Visit: Payer: Self-pay | Admitting: Neurology

## 2022-10-19 ENCOUNTER — Ambulatory Visit (INDEPENDENT_AMBULATORY_CARE_PROVIDER_SITE_OTHER): Payer: HMO | Admitting: Neurology

## 2022-10-19 VITALS — BP 105/70 | HR 80 | Ht 70.0 in | Wt 201.0 lb

## 2022-10-19 DIAGNOSIS — G459 Transient cerebral ischemic attack, unspecified: Secondary | ICD-10-CM | POA: Diagnosis not present

## 2022-10-19 DIAGNOSIS — I639 Cerebral infarction, unspecified: Secondary | ICD-10-CM | POA: Diagnosis not present

## 2022-10-19 DIAGNOSIS — H53461 Homonymous bilateral field defects, right side: Secondary | ICD-10-CM | POA: Diagnosis not present

## 2022-10-19 DIAGNOSIS — I491 Atrial premature depolarization: Secondary | ICD-10-CM

## 2022-10-19 NOTE — Progress Notes (Unsigned)
[facility]    Provider:  Dr Lucia Gaskins Requesting Provider: Alyson Ingles, PA* Primary Care Provider:  Aliene Beams, MD  CC:  Strokes  HPI:  Brian BUFKIN is a 72 y.o. male here as requested by Alyson Ingles, PA* for Strokes. has Lumbosacral radiculopathy at L5; Hypertension; Hyperlipidemia; Nonobstructive atherosclerosis of coronary artery; Pre-operative cardiovascular examination; and VBI (vertebrobasilar insufficiency) on their problem list. [name] is a 72 y.o. male with CAD, DJD, HLD, HTN, OA admitted on 09/03/2022 with blurred vision and dizziness. The patient reports intermittent L sided vision loss with associated dizziness and lightheadedness. He states the episodes will last approximately 10 - 15 seconds and abort without intervention. Patient was travelling in March and lost vision multiple times for a few minutes. He started having epsodes lightheadedness and left peropheral vision 09/03/2022 at [facility] hospital.The vision changes were brief. This had happened before but that particular day it kept happening. He was already on ASA. Syymptoms resolved, NIH 0. CTA head/neck with severe R PCA stenosis. Neurology was consulted and recommended plavix load and MRI head.  CTH no acute intracranial process. 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 Per neurology notes, Patient reports he was off of his medications x 10 weeks (HCTZ 12.5 mg, losartan 100 mg, and Crestor 20 mg) because he "got tired of taking them". He was seen by cardiology for clearance for an upcoming TKR and after urging resumed medications on 08/24/2022.  The patient report ed to them a 6 to 67-month history of brief periods of  lightheadedness and left-sided peripheral vision loss. He reports that episodes would  last approximately 10 - 15 seconds and stop without intervention. He reported on 09/01/2022  he had 2 episodes: the first episode happened while he was shopping with family at about 11 AM and resolved within seconds. Later that day he developed symptoms of feeling generally unwell and lightheaded. He states that his left visual deficit became worse extending to the midline of his vision, along with a headache, and he became concerned and presented to the emergency department for evaluation. By the time of his arrival he reports that his symptoms had resolved. In the emergency department CTA head/neck with severe R PCA stenosis.   [name] subsequently presented to the emergency department 09/18/22 with sudden onset left sided hemianopia. Patient described the episode as self resolving with rest. Patient presented to the emergency room when a second episode occurred later that evening. The second episode again, self resolved. CTA head/neck contrast 4/13824 demonstrated severe P2 PCA stenosis. Patient and his wife consulted with Dr. Julieanne Cotton 09/22/22. There was a detailed discussion regarding the patient's condition as well as treatment options were discussed.The patient and his wife agreed to a cerebral angiogram. This was performed on 09/24/22 by Dr. Corliss Skains. Patient was found a high-grade smooth stenosis of the right posterior cerebral artery P2 segment at that time. Patient presented for percutaneous transluminal coronary angioplasty with stenting with general anesthesia 10/11/22. Revascularization of the stenotic lesion unsuccessful due to the distal location of the lesion via the right posterior communicating artery approach, and the left vertebral artery approach.     Reviewed notes, labs and imaging from outside physicians, which showed:   09/18/2022:  MRI brain COMPARISON:  Same day CTA. (MRI 08/2022 showed  caudate stroke )   FINDINGS: Brain: No acute infarction, hemorrhage, hydrocephalus, extra-axial collection or mass lesion.   Vascular: Major arterial flow voids are maintained at the skull base.   Skull and upper cervical spine: Normal marrow signal.   Sinuses/Orbits: Mild paranasal sinus mucosal thickening. No acute orbital findings.   Other: No sizable mastoid effusions.   IMPRESSION: No evidence of acute intracranial abnormality.    09/18/2022: CTA H&N  IMPRESSION: 1. No evidence of acute large vascular territory infarct or acute hemorrhage. 2. No large vessel occlusion. 3. Severe right P2 PCA stenosis.  Result Date: 09/03/2022 1. Very high-grade severe stenosis of the right PCA P2 segment. Recommend maximal medical management for intracranial arterial stenosis. Consider loading dose of Plavix. Recommend neurology consultation and brain MRI with and without contrast for further evaluation as this may be the etiology of patient's vision loss. 2. 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 3. Mild stenosis of the left PCA P2 segment. 4. No acute intracranial process by noncontrast head CT. CTA NECK FINDINGS: Right vertebral : Hypoplastic. Decreased visualization of the right vertebral artery V2 segment spanning C3-C5 which may relate to stenosis. Left vertebral : The left vertebral origin has no stenosis. The cervical left vertebral is normal. The intracranial left vertebral artery is normal. No dissection of the left vertebral artery. Right CCA/ICA: No significant stenosis of the right CCA. There is no stenosis of the right ICA per NASCET criteria. No dissection of right carotid system. Left CCA/ICA: No significant stenosis of the left CCA. There is no stenosis of the left ICA per NASCET criteria. No dissection of the left carotid system. The visualized aerodigestive tract is normal. No pathologic cervical lymphadenopathy by size criteria. No acute soft  tissue abnormality. The thyroid is normal. Moderate multilevel degenerative disc disease. Mild spinal canal stenosis. Moderate to severe neuroforaminal narrowing. Mild to moderate facet arthropathy. The visualized lung apices are clear. Impression: 1. No dissection or stenosis of either cervical ICA per NASCET criteria. 2. Decreased visualization of the right vertebral artery V2 segment spanning C3-C5 which may relate to stenosis of the hypoplastic vertebral artery. ATTESTATION STATEMENT: The staff radiologist has personally reviewed the images and dictated, reviewed and/or edited the final report. READING SITE: [facility].   CT Angio Neck  Result Date: 09/03/2022 1. Very high-grade severe stenosis of the right PCA P2 segment. Recommend maximal medical management for intracranial arterial stenosis. Consider loading dose of Plavix. Recommend neurology consultation and brain MRI with and without contrast for further evaluation as this may be the etiology of patient's vision loss. 2. 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 3. Mild stenosis of the left PCA P2 segment. 4. No acute intracranial process by noncontrast head CT. CTA NECK FINDINGS: Right vertebral : Hypoplastic. Decreased visualization of the right vertebral artery V2 segment spanning C3-C5 which may relate to stenosis. Left vertebral : The left vertebral origin has no stenosis. The cervical left vertebral is normal. The intracranial left vertebral artery is normal. No dissection of the left vertebral artery. Right CCA/ICA: No significant stenosis of the right CCA. There is no stenosis of the right ICA per NASCET criteria. No dissection of right carotid system. Left CCA/ICA: No significant stenosis of the left CCA. There is no stenosis of the left ICA per NASCET criteria. No dissection of the left carotid system. The visualized aerodigestive tract  is normal. No pathologic cervical lymphadenopathy by size criteria. No  acute soft tissue abnormality. The thyroid is normal. Moderate multilevel degenerative disc disease. Mild spinal canal stenosis. Moderate to severe neuroforaminal narrowing. Mild to moderate facet arthropathy. The visualized lung apices are clear. Impression: 1. No dissection or stenosis of either cervical ICA per NASCET criteria. 2. Decreased visualization of the right vertebral artery V2 segment spanning C3-C5 which may relate to stenosis of the hypoplastic vertebral artery. ATTESTATION STATEMENT: The staff radiologist has personally reviewed the images and dictated, reviewed and/or edited the final report. READING SITE: [facility] 5.   MRI Head wo contrast  Result Date: 09/03/2022 1. Punctate acute infarct in the right putamen. No acute hemorrhage. 2. Mild generalized atrophy and chronic small vessel ischemic disease. The above findings were communicated by Epic chat to Loralee Pacas at 09/03/2022 8:34 PM. READING SITE: [facility] 5. ATTESTATION STATEMENT: The Staff Radiologist has personally reviewed the images and dictated, reviewed, or edited the final report.   Review of Systems: Patient complains of symptoms per HPI as well as the following symptoms dizziness. Pertinent negatives and positives per HPI. All others negative.   Social History   Socioeconomic History   Marital status: Married    Spouse name: Not on file   Number of children: Not on file   Years of education: Not on file   Highest education level: Not on file  Occupational History   Not on file  Tobacco Use   Smoking status: Never   Smokeless tobacco: Never  Vaping Use   Vaping Use: Never used  Substance and Sexual Activity   Alcohol use: Yes    Comment: occasional   Drug use: Never   Sexual activity: Not on file  Other Topics Concern   Not on file  Social History Narrative   Caffeine occasional coffee   Education: masters.   Retired. From ministry.    Social Determinants of Health   Financial Resource Strain: Not on file   Food Insecurity: Not on file  Transportation Needs: Not on file  Physical Activity: Not on file  Stress: Not on file  Social Connections: Not on file  Intimate Partner Violence: Not on file    History reviewed. No pertinent family history.  Past Medical History:  Diagnosis Date   COVID    DJD (degenerative joint disease) of knee    Dyspnea on exertion    Hearing loss    Hypercholesteremia    Hypertension    Overweight    Paresthesias    Personal history of colonic polyps    Primary osteoarthritis of both knees    Skin mole    Spinal stenosis    Stenosis of right subclavian artery (HCC)     Patient Active Problem List   Diagnosis Date Noted   VBI (vertebrobasilar insufficiency) 10/11/2022   Hypertension 08/24/2022   Hyperlipidemia 08/24/2022   Nonobstructive atherosclerosis of coronary artery 08/24/2022   Pre-operative cardiovascular examination 08/24/2022   Lumbosacral radiculopathy at L5 02/18/2022    Past Surgical History:  Procedure Laterality Date   APPENDECTOMY     CARDIAC CATHETERIZATION     HERNIA REPAIR     IR ANGIO INTRA EXTRACRAN SEL COM CAROTID INNOMINATE BILAT MOD SED  09/24/2022   IR ANGIO VERTEBRAL SEL SUBCLAVIAN INNOMINATE UNI R MOD SED  09/24/2022   IR ANGIO VERTEBRAL SEL VERTEBRAL UNI L MOD SED  09/24/2022   IR ANGIO VERTEBRAL SEL VERTEBRAL UNI L MOD SED  10/11/2022   IR  CT HEAD LTD  10/11/2022   IR INTRA CRAN STENT  10/11/2022   IR RADIOLOGIST EVAL & MGMT  09/22/2022   IR RADIOLOGIST EVAL & MGMT  09/30/2022   IR US GUIDE VASC ACCESS RIGHT  09/24/2022   KNEE SURGERY Right    RADIOLOGY WITH ANESTHESIA N/A 10/11/2022   Procedure: RPCA angioplasty with stenting;  Surgeon: Julieanne Cotton, MD;  Location: [facility] OR;  Service: Radiology;  Laterality: N/A;   SHOULDER SURGERY Left     Current Outpatient Medications  Medication Sig Dispense Refill   aspirin EC 81 MG tablet Take 81 mg by mouth daily. Swallow whole.     clopidogrel (PLAVIX) 75 MG tablet Take 75 mg  by mouth daily.     Coenzyme Q10 200 MG capsule Take 200 mg by mouth daily.     hydrochlorothiazide (MICROZIDE) 12.5 MG capsule Take 1 capsule (12.5 mg total) by mouth daily. 90 capsule 3   losartan (COZAAR) 100 MG tablet Take 1 tablet (100 mg total) by mouth daily. 90 tablet 3   Multiple Vitamin (MULTIVITAMIN) tablet Take 1 tablet by mouth daily.     rosuvastatin (CRESTOR) 40 MG tablet Take 1 tablet (40 mg total) by mouth daily. 90 tablet 1   sildenafil (VIAGRA) 50 MG tablet Take 50 mg by mouth daily as needed for erectile dysfunction.     TURMERIC PO Take 1,500 mg by mouth daily.     rosuvastatin (CRESTOR) 20 MG tablet Take 1 tablet (20 mg total) by mouth daily. (Patient not taking: Reported on 10/07/2022) 90 tablet 3   No current facility-administered medications for this visit.    Allergies as of 10/19/2022   (No Known Allergies)    Vitals: BP 105/70   Pulse 80   Ht 5\' 10"  (1.778 m)   Wt 201 lb (91.2 kg)   BMI 28.84 kg/m  Last Weight:  Wt Readings from Last 1 Encounters:  10/19/22 201 lb (91.2 kg)   Last Height:   Ht Readings from Last 1 Encounters:  10/19/22 5\' 10"  (1.778 m)     Physical exam: Exam: Gen: NAD, conversant, well nourised, well groomed                     CV: RRR, no MRG. No Carotid Bruits. No peripheral edema, warm, nontender Eyes: Conjunctivae clear without exudates or hemorrhage  Neuro: Detailed Neurologic Exam  Speech:    Speech is normal; fluent and spontaneous with normal comprehension.  Cognition:    The patient is oriented to person, place, and time;     recent and remote memory intact;     language fluent;     normal attention, concentration,     fund of knowledge Cranial Nerves:    The pupils are equal, round, and reactive to light. The fundi are normal and spontaneous venous pulsations are present. Visual fields are full to finger confrontation. Extraocular movements are intact. Trigeminal sensation is intact and the muscles of  mastication are normal. The face is symmetric. The palate elevates in the midline. Hearing intact. Voice is normal. Shoulder shrug is normal. The tongue has normal motion without fasciculations.   Coordination:    Normal finger to nose and heel to shin. Normal rapid alternating movements.   Gait:    Heel-toe and tandem gait are normal.   Motor Observation:    No asymmetry, no atrophy, and no involuntary movements noted. Tone:    Normal muscle tone.    Posture:  Posture is normal. normal erect    Strength: right foot drop (chronic) otherwise strength is V/V in the upper and lower limbs.      Sensation: intact to LT     Reflex Exam:  DTR's:    Deep tendon reflexes in the upper and lower extremities are normal bilaterally.   Toes:    The toes are downgoing bilaterally.   Clonus:    Clonus is absent.    Assessment/Plan:  72 y.o. male here as requested by Alyson Ingles, PA* for Strokes. has Lumbosacral radiculopathy at L5; Hypertension; Hyperlipidemia; Nonobstructive atherosclerosis of coronary artery; Pre-operative cardiovascular examination; and VBI (vertebrobasilar insufficiency) on their problem list. [name] is a 72 y.o. male with CAD, DJD, HLD, HTN, OA. [name] subsequently presented to the emergency department 09/18/22 with sudden onset left sided hemianopia. Patient described the episode as self resolving with rest. Patient presented to the emergency room when a second episode occurred later that evening. The second episode again, self resolved. CTA head/neck contrast 4/13824 demonstrated severe P2 PCA stenosis. Patient and his wife consulted with Dr. Julieanne Cotton 09/22/22. There was a detailed discussion regarding the patient's condition as well as treatment options were discussed.The patient and his wife agreed to a cerebral angiogram. This was performed on 09/24/22 by Dr. Corliss Skains. Patient was found a high-grade smooth stenosis of the right posterior  cerebral artery P2 segment at that time. Patient presented for percutaneous transluminal coronary angioplasty with stenting with general anesthesia 10/11/22. Revascularization of the stenotic lesion unsuccessful due to the distal location of the lesion via the right posterior communicating artery approach, and the left vertebral artery approach.   - admitted on 09/03/2022 with blurred vision and dizziness while on travel. The patient reports intermittent L sided homonomous hemianopia with associated dizziness and lightheadedness for almost a year worsening in march where he went to the ED and was found to have caudate stroke and severe left PCA stenosis. Was treated with DUAP.; had complete stroke evaluation there with ldl, hgba1c and echo.  - 09/03/2022: Very high-grade severe stenosis of the right PCA P2 segment. Recommend maximal medical management for intracranial arterial stenosis. - 09/03/2022: 1. Punctate acute infarct in the right putamen. No acute hemorrhage. 2. Mild generalized atrophy and chronic small vessel ischemic disease.  -  CTA head/neck contrast 09/18/22 demonstrated severe P2 PCA stenosis. Patient and his wife consulted with Dr. Julieanne Cotton 09/22/22.Marland Kitchen Cerebroangiogram performed on 09/24/22 by Dr. Corliss Skains. Patient was found a high-grade smooth stenosis of the right posterior cerebral artery P2 segment at that time.  - Patient presented for percutaneous transluminal coronary angioplasty with stenting with general anesthesia 10/11/22. Revascularization of the stenotic lesion unsuccessful due to the distal location of the lesion via the right posterior communicating artery approach, and the left vertebral artery approach.  - 10/20/2022 Ophthalmology examination was normal, showed no visual field defects - Continue DUAP therapy - Patient and wife deciding whether repeat percutaneous transluminal coronary angioplasty with stenting. Will request a 2nd opinion with Dr. Pearlean Brownie who will see patient at  4pm 10/21/2022, much appreciate my colleague Dr. Pearlean Brownie - recommend keeping BP > 120, avoid hypotension - Caudate stroke and what appears to be a clot in the right PCA P2, need to be evaluated for embolic cause. Will order 30-day heart monitor and then loop recorder; cardiac event monitor ordered - I had a long d/w patient about her recent stroke, risk for recurrent stroke/TIAs, personally independently reviewed imaging studies and stroke  evaluation results and answered questions.Continue DUAP for secondary stroke prevention and maintain strict control of hypertension with blood pressure goal between 120-130/90 avoid hypotension, diabetes with hemoglobin A1c goal below 6.5% and lipids with LDL cholesterol goal below 70 mg/dL. - Will see Dr. Pearlean Brownie for another opinion this week.  Cc: Alyson Ingles, PA*,  Aliene Beams, MD  Brian Dean, MD  [facility] [address] [place], [place] [address]  Phone [phone] Fax [phone]  I spent 100 minutes of face-to-face and non-face-to-face time with patient on the  1. Acute embolic stroke (HCC)   2. Homonymous hemianopia, right   3. TIA (transient ischemic attack)    diagnosis.  This included previsit chart review, lab review, study review, order entry, electronic health record documentation, patient education on the different diagnostic and therapeutic options, counseling and coordination of care, risks and benefits of management, compliance, or risk factor reduction

## 2022-10-20 DIAGNOSIS — H53129 Transient visual loss, unspecified eye: Secondary | ICD-10-CM | POA: Diagnosis not present

## 2022-10-21 ENCOUNTER — Ambulatory Visit (INDEPENDENT_AMBULATORY_CARE_PROVIDER_SITE_OTHER): Payer: HMO | Admitting: Neurology

## 2022-10-21 ENCOUNTER — Other Ambulatory Visit (HOSPITAL_COMMUNITY): Payer: Self-pay

## 2022-10-21 ENCOUNTER — Other Ambulatory Visit (HOSPITAL_COMMUNITY): Payer: Self-pay | Admitting: Interventional Radiology

## 2022-10-21 ENCOUNTER — Telehealth (HOSPITAL_COMMUNITY): Payer: Self-pay | Admitting: Radiology

## 2022-10-21 ENCOUNTER — Encounter: Payer: Self-pay | Admitting: Neurology

## 2022-10-21 VITALS — BP 112/75 | HR 79 | Ht 70.0 in

## 2022-10-21 DIAGNOSIS — Z9189 Other specified personal risk factors, not elsewhere classified: Secondary | ICD-10-CM | POA: Diagnosis not present

## 2022-10-21 DIAGNOSIS — H53462 Homonymous bilateral field defects, left side: Secondary | ICD-10-CM | POA: Diagnosis not present

## 2022-10-21 DIAGNOSIS — G459 Transient cerebral ischemic attack, unspecified: Secondary | ICD-10-CM

## 2022-10-21 DIAGNOSIS — I6621 Occlusion and stenosis of right posterior cerebral artery: Secondary | ICD-10-CM | POA: Diagnosis not present

## 2022-10-21 DIAGNOSIS — I771 Stricture of artery: Secondary | ICD-10-CM

## 2022-10-21 MED ORDER — TICAGRELOR 90 MG PO TABS: 90.0000 mg | ORAL_TABLET | Freq: Two times a day (BID) | ORAL | 3 refills | Status: DC

## 2022-10-21 MED ORDER — TICAGRELOR 90 MG PO TABS
90.0000 mg | ORAL_TABLET | Freq: Two times a day (BID) | ORAL | 3 refills | Status: DC
  Filled 2022-10-21 (×2): qty 60, 30d supply, fill #0

## 2022-10-21 NOTE — Telephone Encounter (Signed)
Called pt to schedule the second attemp to treat patient's right posterior cerebral artery P2 segment stenosis. Patient states that Deveshwar told him he needed to get a particular catheter that was long enough to reach the area that needs to be treated. I will confirm this with Deveshwar and call the patient back to schedule at that time. Patient is in agreement with this plan. JM

## 2022-10-21 NOTE — Patient Instructions (Addendum)
I had a long d/w patient about his recent recurrent left eye episodes of transient vision loss likely TIA symptomatic from his high-grade right posterior cerebral artery stenosis, silent right basal ganglia lacunar infarct, risk for recurrent stroke/TIAs, personally independently reviewed imaging studies and stroke evaluation results and answered questions.recommend he continue aspirin but change Plavix to Brilinta due to failure and having recurrent episodes on it   for secondary stroke prevention for 3 months and  maintain strict control of hypertension with blood pressure goal below 130/90, diabetes with hemoglobin A1c goal below 6.5% and lipids with LDL cholesterol goal below 70 mg/dL. I also advised the patient to eat a healthy diet with plenty of whole grains, cereals, fruits and vegetables, exercise regularly and maintain ideal body weight .check polysomnogram for sleep apnea.  Continue scheduled follow-up with Dr. Corliss Skains for elective right posterior cerebral artery angioplasty stenting.  Followup in the future with me in 4 months or call earlier if necessary.  Stroke Prevention Some medical conditions and behaviors can lead to a higher chance of having a stroke. You can help prevent a stroke by eating healthy, exercising, not smoking, and managing any medical conditions you have. Stroke is a leading cause of functional impairment. Primary prevention is particularly important because a majority of strokes are first-time events. Stroke changes the lives of not only those who experience a stroke but also their family and other caregivers. How can this condition affect me? A stroke is a medical emergency and should be treated right away. A stroke can lead to brain damage and can sometimes be life-threatening. If a person gets medical treatment right away, there is a better chance of surviving and recovering from a stroke. What can increase my risk? The following medical conditions may increase your  risk of a stroke: Cardiovascular disease. High blood pressure (hypertension). Diabetes. High cholesterol. Sickle cell disease. Blood clotting disorders (hypercoagulable state). Obesity. Sleep disorders (obstructive sleep apnea). Other risk factors include: Being older than age 62. Having a history of blood clots, stroke, or mini-stroke (transient ischemic attack, TIA). Genetic factors, such as race, ethnicity, or a family history of stroke. Smoking cigarettes or using other tobacco products. Taking birth control pills, especially if you also use tobacco. Heavy use of alcohol or drugs, especially cocaine and methamphetamine. Physical inactivity. What actions can I take to prevent this? Manage your health conditions High cholesterol levels. Eating a healthy diet is important for preventing high cholesterol. If cholesterol cannot be managed through diet alone, you may need to take medicines. Take any prescribed medicines to control your cholesterol as told by your health care provider. Hypertension. To reduce your risk of stroke, try to keep your blood pressure below 130/80. Eating a healthy diet and exercising regularly are important for controlling blood pressure. If these steps are not enough to manage your blood pressure, you may need to take medicines. Take any prescribed medicines to control hypertension as told by your health care provider. Ask your health care provider if you should monitor your blood pressure at home. Have your blood pressure checked every year, even if your blood pressure is normal. Blood pressure increases with age and some medical conditions. Diabetes. Eating a healthy diet and exercising regularly are important parts of managing your blood sugar (glucose). If your blood sugar cannot be managed through diet and exercise, you may need to take medicines. Take any prescribed medicines to control your diabetes as told by your health care provider. Get evaluated  for obstructive  sleep apnea. Talk to your health care provider about getting a sleep evaluation if you snore a lot or have excessive sleepiness. Make sure that any other medical conditions you have, such as atrial fibrillation or atherosclerosis, are managed. Nutrition Follow instructions from your health care provider about what to eat or drink to help manage your health condition. These instructions may include: Reducing your daily calorie intake. Limiting how much salt (sodium) you use to 1,500 milligrams (mg) each day. Using only healthy fats for cooking, such as olive oil, canola oil, or sunflower oil. Eating healthy foods. You can do this by: Choosing foods that are high in fiber, such as whole grains, and fresh fruits and vegetables. Eating at least 5 servings of fruits and vegetables a day. Try to fill one-half of your plate with fruits and vegetables at each meal. Choosing lean protein foods, such as lean cuts of meat, poultry without skin, fish, tofu, beans, and nuts. Eating low-fat dairy products. Avoiding foods that are high in sodium. This can help lower blood pressure. Avoiding foods that have saturated fat, trans fat, and cholesterol. This can help prevent high cholesterol. Avoiding processed and prepared foods. Counting your daily carbohydrate intake.  Lifestyle If you drink alcohol: Limit how much you have to: 0-1 drink a day for women who are not pregnant. 0-2 drinks a day for men. Know how much alcohol is in your drink. In the [place] one drink equals one 12 oz bottle of beer ( ), one 5 oz glass of wine ( ), or one 1 oz glass of hard liquor (44mL). Do not use any products that contain nicotine or tobacco. These products include cigarettes, chewing tobacco, and vaping devices, such as e-cigarettes. If you need help quitting, ask your health care provider. Avoid secondhand smoke. Do not use drugs. Activity  Try to stay at a healthy weight. Get at least 30 minutes  of exercise on most days, such as: Fast walking. Biking. Swimming. Medicines Take over-the-counter and prescription medicines only as told by your health care provider. Aspirin or blood thinners (antiplatelets or anticoagulants) may be recommended to reduce your risk of forming blood clots that can lead to stroke. Avoid taking birth control pills. Talk to your health care provider about the risks of taking birth control pills if: You are over 26 years old. You smoke. You get very bad headaches. You have had a blood clot. Where to find more information American Stroke Association: www.strokeassociation.org Get help right away if: You or a loved one has any symptoms of a stroke. "BE FAST" is an easy way to remember the main warning signs of a stroke: B - Balance. Signs are dizziness, sudden trouble walking, or loss of balance. E - Eyes. Signs are trouble seeing or a sudden change in vision. F - Face. Signs are sudden weakness or numbness of the face, or the face or eyelid drooping on one side. A - Arms. Signs are weakness or numbness in an arm. This happens suddenly and usually on one side of the body. S - Speech. Signs are sudden trouble speaking, slurred speech, or trouble understanding what people say. T - Time. Time to call emergency services. Write down what time symptoms started. You or a loved one has other signs of a stroke, such as: A sudden, severe headache with no known cause. Nausea or vomiting. Seizure. These symptoms may represent a serious problem that is an emergency. Do not wait to see if the symptoms will go away. Get medical  help right away. Call your local emergency services (911 in the [place] Do not drive yourself to the hospital. Summary You can help to prevent a stroke by eating healthy, exercising, not smoking, limiting alcohol intake, and managing any medical conditions you may have. Do not use any products that contain nicotine or tobacco. These include cigarettes,  chewing tobacco, and vaping devices, such as e-cigarettes. If you need help quitting, ask your health care provider. Remember "BE FAST" for warning signs of a stroke. Get help right away if you or a loved one has any of these signs. This information is not intended to replace advice given to you by your health care provider. Make sure you discuss any questions you have with your health care provider. Document Revised: 12/06/2019 Document Reviewed: 12/24/2019 Elsevier Patient Education  2023 [employer]  DASH Eating Plan DASH stands for Dietary Approaches to Stop Hypertension. The DASH eating plan is a healthy eating plan that has been shown to: Reduce high blood pressure (hypertension). Reduce your risk for type 2 diabetes, heart disease, and stroke. Help with weight loss. What are tips for following this plan? Reading food labels Check food labels for the amount of salt (sodium) per serving. Choose foods with less than 5 percent of the Daily Value of sodium. Generally, foods with less than 300 milligrams (mg) of sodium per serving fit into this eating plan. To find whole grains, look for the word "whole" as the first word in the ingredient list. Shopping Buy products labeled as "low-sodium" or "no salt added." Buy fresh foods. Avoid canned foods and pre-made or frozen meals. Cooking Avoid adding salt when cooking. Use salt-free seasonings or herbs instead of table salt or sea salt. Check with your health care provider or pharmacist before using salt substitutes. Do not fry foods. Cook foods using healthy methods such as baking, boiling, grilling, roasting, and broiling instead. Cook with heart-healthy oils, such as olive, canola, avocado, soybean, or sunflower oil. Meal planning  Eat a balanced diet that includes: 4 or more servings of fruits and 4 or more servings of vegetables each day. Try to fill one-half of your plate with fruits and vegetables. 6-8 servings of whole grains each  day. Less than 6 oz (170 g) of lean meat, poultry, or fish each day. A 3-oz (85-g) serving of meat is about the same size as a deck of cards. One egg equals 1 oz (28 g). 2-3 servings of low-fat dairy each day. One serving is 1 cup (237 mL). 1 serving of nuts, seeds, or beans 5 times each week. 2-3 servings of heart-healthy fats. Healthy fats called omega-3 fatty acids are found in foods such as walnuts, flaxseeds, fortified milks, and eggs. These fats are also found in cold-water fish, such as sardines, salmon, and mackerel. Limit how much you eat of: Canned or prepackaged foods. Food that is high in trans fat, such as some fried foods. Food that is high in saturated fat, such as fatty meat. Desserts and other sweets, sugary drinks, and other foods with added sugar. Full-fat dairy products. Do not salt foods before eating. Do not eat more than 4 egg yolks a week. Try to eat at least 2 vegetarian meals a week. Eat more home-cooked food and less restaurant, buffet, and fast food. Lifestyle When eating at a restaurant, ask that your food be prepared with less salt or no salt, if possible. If you drink alcohol: Limit how much you use to: 0-1 drink a day for  women who are not pregnant. 0-2 drinks a day for men. Be aware of how much alcohol is in your drink. In the [place] one drink equals one 12 oz bottle of beer (355 mL), one 5 oz glass of wine (148 mL), or one 1 oz glass of hard liquor (44 mL). General information Avoid eating more than 2,300 mg of salt a day. If you have hypertension, you may need to reduce your sodium intake to 1,500 mg a day. Work with your health care provider to maintain a healthy body weight or to lose weight. Ask what an ideal weight is for you. Get at least 30 minutes of exercise that causes your heart to beat faster (aerobic exercise) most days of the week. Activities may include walking, swimming, or biking. Work with your health care provider or dietitian to adjust  your eating plan to your individual calorie needs. What foods should I eat? Fruits All fresh, dried, or frozen fruit. Canned fruit in natural juice (without added sugar). Vegetables Fresh or frozen vegetables (raw, steamed, roasted, or grilled). Low-sodium or reduced-sodium tomato and vegetable juice. Low-sodium or reduced-sodium tomato sauce and tomato paste. Low-sodium or reduced-sodium canned vegetables. Grains Whole-grain or whole-wheat bread. Whole-grain or whole-wheat pasta. Brown rice. Orpah Cobb. Bulgur. Whole-grain and low-sodium cereals. Pita bread. Low-fat, low-sodium crackers. Whole-wheat flour tortillas. Meats and other proteins Skinless chicken or [place]. Ground chicken or [place]. Pork with fat trimmed off. Fish and seafood. Egg whites. Dried beans, peas, or lentils. Unsalted nuts, nut butters, and seeds. Unsalted canned beans. Lean cuts of beef with fat trimmed off. Low-sodium, lean precooked or cured meat, such as sausages or meat loaves. Dairy Low-fat (1%) or fat-free (skim) milk. Reduced-fat, low-fat, or fat-free cheeses. Nonfat, low-sodium ricotta or cottage cheese. Low-fat or nonfat yogurt. Low-fat, low-sodium cheese. Fats and oils Soft margarine without trans fats. Vegetable oil. Reduced-fat, low-fat, or light mayonnaise and salad dressings (reduced-sodium). Canola, safflower, olive, avocado, soybean, and sunflower oils. Avocado. Seasonings and condiments Herbs. Spices. Seasoning mixes without salt. Other foods Unsalted popcorn and pretzels. Fat-free sweets. The items listed above may not be a complete list of foods and beverages you can eat. Contact a dietitian for more information. What foods should I avoid? Fruits Canned fruit in a light or heavy syrup. Fried fruit. Fruit in cream or butter sauce. Vegetables Creamed or fried vegetables. Vegetables in a cheese sauce. Regular canned vegetables (not low-sodium or reduced-sodium). Regular canned tomato sauce and paste  (not low-sodium or reduced-sodium). Regular tomato and vegetable juice (not low-sodium or reduced-sodium). Rosita Fire. Olives. Grains Baked goods made with fat, such as croissants, muffins, or some breads. Dry pasta or rice meal packs. Meats and other proteins Fatty cuts of meat. Ribs. Fried meat. Tomasa Blase. Bologna, salami, and other precooked or cured meats, such as sausages or meat loaves. Fat from the back of a pig (fatback). Bratwurst. Salted nuts and seeds. Canned beans with added salt. Canned or smoked fish. Whole eggs or egg yolks. Chicken or [place] with skin. Dairy Whole or 2% milk, cream, and half-and-half. Whole or full-fat cream cheese. Whole-fat or sweetened yogurt. Full-fat cheese. Nondairy creamers. Whipped toppings. Processed cheese and cheese spreads. Fats and oils Butter. Stick margarine. Lard. Shortening. Ghee. Bacon fat. Tropical oils, such as coconut, palm kernel, or palm oil. Seasonings and condiments Onion salt, garlic salt, seasoned salt, table salt, and sea salt. Worcestershire sauce. Tartar sauce. Barbecue sauce. Teriyaki sauce. Soy sauce, including reduced-sodium. Steak sauce. Canned and packaged gravies. Fish sauce. [employer]  sauce. Cocktail sauce. Store-bought horseradish. Ketchup. Mustard. Meat flavorings and tenderizers. Bouillon cubes. Hot sauces. Pre-made or packaged marinades. Pre-made or packaged taco seasonings. Relishes. Regular salad dressings. Other foods Salted popcorn and pretzels. The items listed above may not be a complete list of foods and beverages you should avoid. Contact a dietitian for more information. Where to find more information National Heart, Lung, and Blood Institute: PopSteam.is American Heart Association: www.heart.org Academy of Nutrition and Dietetics: www.eatright.org National Kidney Foundation: www.kidney.org Summary The DASH eating plan is a healthy eating plan that has been shown to reduce high blood pressure (hypertension). It may  also reduce your risk for type 2 diabetes, heart disease, and stroke. When on the DASH eating plan, aim to eat more fresh fruits and vegetables, whole grains, lean proteins, low-fat dairy, and heart-healthy fats. With the DASH eating plan, you should limit salt (sodium) intake to 2,300 mg a day. If you have hypertension, you may need to reduce your sodium intake to 1,500 mg a day. Work with your health care provider or dietitian to adjust your eating plan to your individual calorie needs. This information is not intended to replace advice given to you by your health care provider. Make sure you discuss any questions you have with your health care provider. Document Revised: 04/27/2019 Document Reviewed: 04/27/2019 Elsevier Patient Education  2023 [employer].

## 2022-10-21 NOTE — Progress Notes (Signed)
[facility] [address] [place]. [place] [address] [phone]       OFFICE CONSULT NOTE  Brian. [name] Date of Birth:  Oct 13, 1950 Medical Record Number:  [id number]   Referring MD:  [name]  Reason for Referral: Second opinion  HPI:[name] is a pleasant 72 year old Caucasian male seen today for initial office visit for consultation for TIA intracranial stenosis.  He is accompanied by his wife.  History is obtained from them and review of electronic medical records as well as records from [facility] in [place] and I personally reviewed pertinent available imaging films in PACS.  He has past medical history of hypertension, hyperlipidemia, coronary artery disease, degenerative lumbar spine disease, mild obesity and spinal stenosis.  Patient was visiting [facility] 09/03/2022 he had multiple episodes of transient left-sided vision loss.  He stated that his left-sided visual field became dark suddenly without warning or any associated symptoms.  This lasted less than few minutes and resolved.  Happened 3 times.  He presented to the [facility] where he was admitted.  CT head was unremarkable and CT angiogram of the head and neck showed severe near occlusive right P2 segment PCA stenosis.  There was fenestration of the proximal right MCA M1 segment with mild stenosis.  There is also mild stenosis of the left PCA in the P2 segment.  There is diminished visualization of the right vertebral artery in the V2 segment.  Patient was started on dual antiplatelet therapy.  LDL cholesterol was 70.2.  Echocardiogram showed ejection fraction of 55%.  There is no left atrial enlargement.  MRI scan of the brain showed punctate tiny acute infarct in the right putamen.  There is mild generalized atrophy and changes of small vessel disease.  Patient continues to have recurrent transient episodes of left eye hemifield vision loss and was referred by her  primary care physician to Brian. Corliss Johnston who did a repeat CT angiogram on 09/18/2022 which confirmed severe high-grade right P2 segment PCA stenosis.  Diagnostic cerebral catheter angiogram on 09/27/2022 confirmed high-grade stenosis of the right posterior cerebral artery in the P2 segment with 75% stenosis of proximal right subclavian artery with poststenotic dilatation.  There was mild to moderate stenosis of the right posterior cerebral artery proximally.  After discussion of risk benefits of angioplasty stenting.  She will return on 10/11/2022 for elective angioplasty stenting but procedure was unsuccessful as the balloon could not reach the area of narrowing and the catheter was not long enough.  Brian. Corliss Johnston plans to reattempt the procedure after and order a catheter which is long enough to reach that.  Patient continues to have these recurrent episodes since he states he is tired about 20 of these episodes in the last 40 days..  The episodes are stereotypical.  There is no obvious triggers.  Episodes appear more prominent when patient is tired or does some physical exertion.  He denies accompanying headache, double vision, vertigo, numbness, weakness, gait or balance problems.  Denies any known prior history of strokes or TIAs.  He has prior history of chronic right foot drop from degenerative spine disease.  ROS:   14 system review of systems is positive for vision loss only all other systems negative  PMH:  Past Medical History:  Diagnosis Date   COVID    DJD (degenerative joint disease) of knee    Dyspnea on exertion    Hearing loss    Hypercholesteremia  Hypertension    Overweight    Paresthesias    Personal history of colonic polyps    Primary osteoarthritis of both knees    Skin mole    Spinal stenosis    Stenosis of right subclavian artery (HCC)     Social History:  Social History   Socioeconomic History   Marital status: Married    Spouse name: Not on file   Number of  children: Not on file   Years of education: Not on file   Highest education level: Not on file  Occupational History   Not on file  Tobacco Use   Smoking status: Never   Smokeless tobacco: Never  Vaping Use   Vaping Use: Never used  Substance and Sexual Activity   Alcohol use: Yes    Comment: occasional   Drug use: Never   Sexual activity: Not on file  Other Topics Concern   Not on file  Social History Narrative   Caffeine occasional coffee   Education: masters.   Retired. From ministry.    Social Determinants of Health   Financial Resource Strain: Not on file  Food Insecurity: Not on file  Transportation Needs: Not on file  Physical Activity: Not on file  Stress: Not on file  Social Connections: Not on file  Intimate Partner Violence: Not on file    Medications:   Current Outpatient Medications on File Prior to Visit  Medication Sig Dispense Refill   aspirin EC 81 MG tablet Take 81 mg by mouth daily. Swallow whole.     clopidogrel (PLAVIX) 75 MG tablet Take 75 mg by mouth daily.     Coenzyme Q10 200 MG capsule Take 200 mg by mouth daily.     hydrochlorothiazide (MICROZIDE) 12.5 MG capsule Take 1 capsule (12.5 mg total) by mouth daily. 90 capsule 3   losartan (COZAAR) 100 MG tablet Take 1 tablet (100 mg total) by mouth daily. 90 tablet 3   Multiple Vitamin (MULTIVITAMIN) tablet Take 1 tablet by mouth daily.     rosuvastatin (CRESTOR) 40 MG tablet Take 1 tablet (40 mg total) by mouth daily. 90 tablet 1   sildenafil (VIAGRA) 50 MG tablet Take 50 mg by mouth daily as needed for erectile dysfunction.     TURMERIC PO Take 1,500 mg by mouth daily.     No current facility-administered medications on file prior to visit.    Allergies:  No Known Allergies  Physical Exam General: well developed, well nourished pleasant elderly Caucasian male, seated, in no evident distress Head: head normocephalic and atraumatic.   Neck: supple with no carotid or supraclavicular  bruits Cardiovascular: regular rate and rhythm, no murmurs Musculoskeletal: no deformity Skin:  no rash/petichiae Vascular:  Normal pulses all extremities  Neurologic Exam Mental Status: Awake and fully alert. Oriented to place and time. Recent and remote memory intact. Attention span, concentration and fund of knowledge appropriate. Mood and affect appropriate.  Cranial Nerves: Fundoscopic exam reveals sharp disc margins. Pupils equal, briskly reactive to light. Extraocular movements full without nystagmus. Visual fields full to confrontation. Hearing intact. Facial sensation intact. Face, tongue, palate moves normally and symmetrically.  Motor: Normal bulk and tone. Normal strength in all tested extremity muscles except right foot drop with ankle dorsiflexor weakness.. Sensory.: intact to touch , pinprick , position and vibratory sensation.  Coordination: Rapid alternating movements normal in all extremities. Finger-to-nose and heel-to-shin performed accurately bilaterally. Gait and Station: Arises from chair without difficulty. Stance is normal. Gait demonstrates normal  stride length and balance and walks with a right foot drop.. Able to heel, toe and tandem walk with slight difficulty.  Reflexes: 1+ and symmetric. Toes downgoing.   NIHSS  0 Modified Rankin  1   ASSESSMENT: 72 year old Caucasian male with recurrent episodes of transient left sided hemianopsia March 2024 likely due to symptomatic right P2 segment PCA high-grade stenosis.  MRI also showed silent small right basal ganglia lacunar infarct.  Vascular risk factors of hypertension, hyperlipidemia, intracranial stenosis, mild obesity and at risk for sleep apnea History of right foot drop from degenerative lumbar spine disease.    PLAN:I had a long d/w patient about his recent recurrent left eye episodes of transient vision loss likely TIA symptomatic from his high-grade right posterior cerebral artery stenosis, silent right basal  ganglia lacunar infarct, risk for recurrent stroke/TIAs, personally independently reviewed imaging studies and stroke evaluation results and answered questions.recommend he continue aspirin but change Plavix to Brilinta due to failure and having recurrent episodes on it   for secondary stroke prevention for 3 months and  maintain strict control of hypertension with blood pressure goal below 130/90, diabetes with hemoglobin A1c goal below 6.5% and lipids with LDL cholesterol goal below 70 mg/dL. I also advised the patient to eat a healthy diet with plenty of whole grains, cereals, fruits and vegetables, exercise regularly and maintain ideal body weight .check polysomnogram for sleep apnea.  Since patient appears to have failed medical therapy with recurrent symptoms it is not unreasonable to consider angioplasty stenting below his lesion seems likely distal and getting a catheter to reach to the area of narrowing may be technically challenging.  Continue scheduled follow-up with Brian. Corliss Johnston for elective right posterior cerebral artery angioplasty stenting.  Followup in the future with me in 4 months or call earlier if necessary.  Keep appointment for 30-day heart monitor.  Greater than 50% time during this 45-minute consultation visit was spent on counseling and coordination of care about his recurrent TIAs and symptomatic right PCA stenosis and discussion about stroke prevention and treatment and answering questions. Delia Heady, MD  Note: This document was prepared with digital dictation and possible smart phrase technology. Any transcriptional errors that result from this process are unintentional.

## 2022-10-22 ENCOUNTER — Other Ambulatory Visit (HOSPITAL_COMMUNITY): Payer: Self-pay

## 2022-10-22 DIAGNOSIS — I771 Stricture of artery: Secondary | ICD-10-CM | POA: Diagnosis not present

## 2022-10-22 DIAGNOSIS — I491 Atrial premature depolarization: Secondary | ICD-10-CM | POA: Diagnosis not present

## 2022-10-22 DIAGNOSIS — I639 Cerebral infarction, unspecified: Secondary | ICD-10-CM | POA: Diagnosis not present

## 2022-10-22 DIAGNOSIS — I669 Occlusion and stenosis of unspecified cerebral artery: Secondary | ICD-10-CM | POA: Diagnosis not present

## 2022-10-22 DIAGNOSIS — Z8673 Personal history of transient ischemic attack (TIA), and cerebral infarction without residual deficits: Secondary | ICD-10-CM | POA: Diagnosis not present

## 2022-10-22 DIAGNOSIS — Z9289 Personal history of other medical treatment: Secondary | ICD-10-CM | POA: Diagnosis not present

## 2022-10-23 ENCOUNTER — Telehealth: Payer: Self-pay | Admitting: Diagnostic Neuroimaging

## 2022-10-23 NOTE — Telephone Encounter (Signed)
Dr. Tracie Harrier called on 10/22/22. Patient was having some low BP readings (SBP 90-100) with some lightheadedness. Given intracranial stenosis and TIAs, Dr. Tracie Harrier and I decided to slightly reduce BP meds (will hold HCTZ) and monitor BP at home.    Suanne Marker, MD 10/23/2022, 4:47 PM Certified in Neurology, Neurophysiology and Neuroimaging  [facility] [address] [place], [place] [address] [phone]

## 2022-10-26 DIAGNOSIS — X32XXXD Exposure to sunlight, subsequent encounter: Secondary | ICD-10-CM | POA: Diagnosis not present

## 2022-10-26 DIAGNOSIS — Z1283 Encounter for screening for malignant neoplasm of skin: Secondary | ICD-10-CM | POA: Diagnosis not present

## 2022-10-26 DIAGNOSIS — L570 Actinic keratosis: Secondary | ICD-10-CM | POA: Diagnosis not present

## 2022-10-26 DIAGNOSIS — D225 Melanocytic nevi of trunk: Secondary | ICD-10-CM | POA: Diagnosis not present

## 2022-10-28 ENCOUNTER — Telehealth (HOSPITAL_COMMUNITY): Payer: Self-pay

## 2022-10-28 NOTE — Telephone Encounter (Signed)
Pt called to find out if the catheter has come in for his procedure with Dr. Corliss Skains. Catheter is still not in, Victorino Dike will reach out to the pt when it is so they can schedule. AB

## 2022-11-08 ENCOUNTER — Other Ambulatory Visit (HOSPITAL_COMMUNITY): Payer: Self-pay | Admitting: Interventional Radiology

## 2022-11-08 DIAGNOSIS — I771 Stricture of artery: Secondary | ICD-10-CM

## 2022-11-11 ENCOUNTER — Encounter (HOSPITAL_COMMUNITY): Payer: Self-pay | Admitting: Interventional Radiology

## 2022-11-12 ENCOUNTER — Other Ambulatory Visit: Payer: Self-pay

## 2022-11-12 ENCOUNTER — Inpatient Hospital Stay (HOSPITAL_COMMUNITY): Payer: HMO | Admitting: Physician Assistant

## 2022-11-12 ENCOUNTER — Encounter (HOSPITAL_COMMUNITY): Payer: Self-pay | Admitting: Interventional Radiology

## 2022-11-12 ENCOUNTER — Other Ambulatory Visit: Payer: Self-pay | Admitting: Internal Medicine

## 2022-11-12 DIAGNOSIS — I2510 Atherosclerotic heart disease of native coronary artery without angina pectoris: Secondary | ICD-10-CM

## 2022-11-12 NOTE — Progress Notes (Signed)
SDW call  Patient was given pre-op instructions over the phone. Patient verbalized understanding of instructions provided.    PCP - Dr. Aliene Beams Cardiologist - Perlie Gold, PA-C, [facility] Neurology: Dr. Joycelyn Schmid Pulmonary:    PPM/ICD - Denies.  Currently wearing a holter monitor  Chest x-ray - n/a  EKG -  09/20/2022 Stress Test - ECHO - 10/02/2020 Cardiac Cath -   Sleep Study/sleep apnea/CPAP: denies  Non-diabetic   Blood Thinner Instructions: Brilinta, continue Aspirin Instructions:ASA, continue   ERAS Protcol - No, NPO PRE-SURGERY Ensure or G2-    COVID TEST- n/a    Anesthesia review: Yes.  HTN, PVD, Dyspnea on exertion, high cholesterol, stenosis right sublavian artery, stroke   Patient denies shortness of breath, fever, cough and chest pain over the phone call  Your procedure is scheduled on Monday November 15, 2022  Report to [facility] Main Entrance "A" at 0530 A.M., then check in with the Admitting office.  Call this number if you have problems the morning of surgery:  [phone]   If you have any questions prior to your surgery date call [phone]: Open Monday-Friday 8am-4pm If you experience any cold or flu symptoms such as cough, fever, chills, shortness of breath, etc. between now and your scheduled surgery, please notify us at the above number    Remember:  Do not eat or drink after midnight the night before your surgery  Take these medicines the morning of surgery with A SIP OF WATER:  Crestor, ASA, Brilinta  As of today, STOP taking any Aleve, Naproxen, Ibuprofen, Motrin, Advil, Goody's, BC's, all herbal medications, fish oil, and all vitamins.

## 2022-11-14 NOTE — Anesthesia Preprocedure Evaluation (Signed)
Anesthesia Evaluation  Patient identified by MRN, date of birth, ID band Patient awake    Reviewed: Allergy & Precautions, H&P , NPO status , Patient's Chart, lab work & pertinent test results  Airway Mallampati: III  TM Distance: >3 FB Neck ROM: Full    Dental no notable dental hx. (+) Teeth Intact, Dental Advisory Given   Pulmonary neg pulmonary ROS   Pulmonary exam normal breath sounds clear to auscultation       Cardiovascular Exercise Tolerance: Good hypertension, Pt. on medications + Peripheral Vascular Disease   Rhythm:Regular Rate:Normal     Neuro/Psych CVA, No Residual Symptoms  negative psych ROS   GI/Hepatic negative GI ROS, Neg liver ROS,,,  Endo/Other  negative endocrine ROS    Renal/GU negative Renal ROS  negative genitourinary   Musculoskeletal  (+) Arthritis , Osteoarthritis,    Abdominal   Peds  Hematology negative hematology ROS (+)   Anesthesia Other Findings   Reproductive/Obstetrics negative OB ROS                             Anesthesia Physical Anesthesia Plan  ASA: 3  Anesthesia Plan: General   Post-op Pain Management: Tylenol PO (pre-op)*   Induction: Intravenous  PONV Risk Score and Plan: 3 and Ondansetron, Dexamethasone and Treatment may vary due to age or medical condition  Airway Management Planned: Oral ETT  Additional Equipment: Arterial line  Intra-op Plan:   Post-operative Plan: Extubation in OR  Informed Consent: I have reviewed the patients History and Physical, chart, labs and discussed the procedure including the risks, benefits and alternatives for the proposed anesthesia with the patient or authorized representative who has indicated his/her understanding and acceptance.     Dental advisory given  Plan Discussed with: CRNA  Anesthesia Plan Comments:        Anesthesia Quick Evaluation

## 2022-11-15 ENCOUNTER — Encounter (HOSPITAL_COMMUNITY): Payer: Self-pay

## 2022-11-15 ENCOUNTER — Ambulatory Visit (HOSPITAL_COMMUNITY)
Admission: RE | Admit: 2022-11-15 | Discharge: 2022-11-15 | Disposition: A | Discharge status: Home / Self Care | Payer: HMO | Source: Ambulatory Visit | Attending: Interventional Radiology | Admitting: Interventional Radiology

## 2022-11-15 ENCOUNTER — Encounter (HOSPITAL_COMMUNITY)
Admission: RE | Disposition: A | Discharge status: Home / Self Care | Payer: Self-pay | Source: Home / Self Care | Attending: Interventional Radiology

## 2022-11-15 ENCOUNTER — Ambulatory Visit (HOSPITAL_COMMUNITY)
Admission: RE | Admit: 2022-11-15 | Discharge: 2022-11-15 | Disposition: A | Discharge status: Home / Self Care | Payer: HMO | Attending: Interventional Radiology | Admitting: Interventional Radiology

## 2022-11-15 DIAGNOSIS — I6621 Occlusion and stenosis of right posterior cerebral artery: Secondary | ICD-10-CM | POA: Diagnosis not present

## 2022-11-15 DIAGNOSIS — Z539 Procedure and treatment not carried out, unspecified reason: Secondary | ICD-10-CM | POA: Diagnosis not present

## 2022-11-15 DIAGNOSIS — G450 Vertebro-basilar artery syndrome: Secondary | ICD-10-CM

## 2022-11-15 DIAGNOSIS — Z7902 Long term (current) use of antithrombotics/antiplatelets: Secondary | ICD-10-CM | POA: Diagnosis not present

## 2022-11-15 DIAGNOSIS — I771 Stricture of artery: Secondary | ICD-10-CM | POA: Diagnosis not present

## 2022-11-15 DIAGNOSIS — Z7982 Long term (current) use of aspirin: Secondary | ICD-10-CM | POA: Insufficient documentation

## 2022-11-15 DIAGNOSIS — I2510 Atherosclerotic heart disease of native coronary artery without angina pectoris: Secondary | ICD-10-CM

## 2022-11-15 HISTORY — DX: Cerebral infarction, unspecified: I63.9

## 2022-11-15 LAB — CBC WITH DIFFERENTIAL/PLATELET
Abs Immature Granulocytes: 0.01 10*3/uL (ref 0.00–0.07)
Basophils Absolute: 0 10*3/uL (ref 0.0–0.1)
Basophils Relative: 1 %
Eosinophils Absolute: 0.1 10*3/uL (ref 0.0–0.5)
Eosinophils Relative: 3 %
HCT: 41.2 % (ref 39.0–52.0)
Hemoglobin: 13.7 g/dL (ref 13.0–17.0)
Immature Granulocytes: 0 %
Lymphocytes Relative: 31 %
Lymphs Abs: 1.3 10*3/uL (ref 0.7–4.0)
MCH: 30.6 pg (ref 26.0–34.0)
MCHC: 33.3 g/dL (ref 30.0–36.0)
MCV: 92.2 fL (ref 80.0–100.0)
Monocytes Absolute: 0.5 10*3/uL (ref 0.1–1.0)
Monocytes Relative: 11 %
Neutro Abs: 2.3 10*3/uL (ref 1.7–7.7)
Neutrophils Relative %: 54 %
Platelets: 147 10*3/uL — ABNORMAL LOW (ref 150–400)
RBC: 4.47 MIL/uL (ref 4.22–5.81)
RDW: 13.2 % (ref 11.5–15.5)
WBC: 4.2 10*3/uL (ref 4.0–10.5)
nRBC: 0 % (ref 0.0–0.2)

## 2022-11-15 LAB — PROTIME-INR
INR: 1 (ref 0.8–1.2)
Prothrombin Time: 13.7 seconds (ref 11.4–15.2)

## 2022-11-15 LAB — BASIC METABOLIC PANEL
Anion gap: 13 (ref 5–15)
BUN: 22 mg/dL (ref 8–23)
CO2: 24 mmol/L (ref 22–32)
Calcium: 9.2 mg/dL (ref 8.9–10.3)
Chloride: 106 mmol/L (ref 98–111)
Creatinine, Ser: 1.02 mg/dL (ref 0.61–1.24)
GFR, Estimated: >60 mL/min (ref >60)
Glucose, Bld: 103 mg/dL — ABNORMAL HIGH (ref 70–99)
Potassium: 3.4 mmol/L — ABNORMAL LOW (ref 3.5–5.1)
Sodium: 143 mmol/L (ref 135–145)

## 2022-11-15 SURGERY — IR WITH ANESTHESIA
Anesthesia: General

## 2022-11-15 MED ORDER — NIMODIPINE 30 MG PO CAPS: 0.0000 mg | ORAL_CAPSULE | ORAL | Status: AC

## 2022-11-15 MED ORDER — LACTATED RINGERS IV SOLN: INTRAVENOUS | Status: DC

## 2022-11-15 MED ORDER — CHLORHEXIDINE GLUCONATE 0.12 % MT SOLN
15.0000 mL | Freq: Once | OROMUCOSAL | Status: AC
  Administered 2022-11-15: 15 mL via OROMUCOSAL

## 2022-11-15 MED ORDER — ASPIRIN 325 MG PO TBEC
DELAYED_RELEASE_TABLET | ORAL | Status: AC
  Filled 2022-11-15: qty 1

## 2022-11-15 MED ORDER — ASPIRIN 81 MG PO CHEW: 325.0000 mg | CHEWABLE_TABLET | ORAL | Status: DC

## 2022-11-15 MED ORDER — CLOPIDOGREL BISULFATE 75 MG PO TABS
ORAL_TABLET | ORAL | Status: AC
  Filled 2022-11-15: qty 1

## 2022-11-15 MED ORDER — ACETAMINOPHEN 500 MG PO TABS: 1000.0000 mg | ORAL_TABLET | Freq: Once | ORAL | Status: DC

## 2022-11-15 MED ORDER — CEFAZOLIN SODIUM-DEXTROSE 2-4 GM/100ML-% IV SOLN
INTRAVENOUS | Status: AC
  Filled 2022-11-15: qty 100

## 2022-11-15 MED ORDER — CHLORHEXIDINE GLUCONATE 0.12 % MT SOLN
OROMUCOSAL | Status: AC
  Filled 2022-11-15: qty 15

## 2022-11-15 MED ORDER — CLOPIDOGREL BISULFATE 75 MG PO TABS: 75.0000 mg | ORAL_TABLET | ORAL | Status: DC

## 2022-11-15 MED ORDER — NIMODIPINE 30 MG PO CAPS
ORAL_CAPSULE | ORAL | Status: AC
  Administered 2022-11-15: 30 mg via ORAL
  Filled 2022-11-15: qty 2

## 2022-11-15 MED ORDER — ACETAMINOPHEN 500 MG PO TABS
ORAL_TABLET | ORAL | Status: AC
  Filled 2022-11-15: qty 2

## 2022-11-15 MED ORDER — ORAL CARE MOUTH RINSE: 15.0000 mL | Freq: Once | OROMUCOSAL | Status: AC

## 2022-11-15 MED ORDER — CEFAZOLIN SODIUM-DEXTROSE 2-4 GM/100ML-% IV SOLN: 2.0000 g | INTRAVENOUS | Status: DC

## 2022-11-15 MED ORDER — SODIUM CHLORIDE 0.9 % IV SOLN: INTRAVENOUS | Status: DC

## 2022-11-15 NOTE — H&P (Signed)
Chief Complaint: Patient was seen in consultation today for Right posterior cerebral artery angioplasty/stent for cerebral artery stenosis   Supervising Physician: Julieanne Cotton  Patient Status: [facility] - Out-pt  History of Present Illness: [name] is a 72 y.o. male    FULL Code Status per pt Known to NIR  Attempted angioplasty stent of Right posterior cerebral artery stenosis angioplasty/stent 10/11/22  Severe stenosis of the right posterior cerebral artery mid to distal  P 2 segment.  Revascularization of the stenotic lesion unsuccessful due to the distal location of the lesion via the right posterior communicating artery approach, and the left vertebral artery approach. 8 French Angio-Seal closure device deployed of the right groin puncture site.  Distal pulses all dopplerable. Post CT brain no evidence of hemorrhagic complications.  New catheter ordered and now in . Dr Corliss Skains to re attempt angioplasty/stent today in IR  Pt has no complaints Taking Brilinta and ASA daily     Past Medical History:  Diagnosis Date   COVID    DJD (degenerative joint disease) of knee    Dyspnea on exertion    Hearing loss    Hypercholesteremia    Hypertension    Overweight    Paresthesias    Personal history of colonic polyps    Primary osteoarthritis of both knees    Skin mole    Spinal stenosis    Stenosis of right subclavian artery (HCC)    Stroke [facility])     Past Surgical History:  Procedure Laterality Date   APPENDECTOMY     CARDIAC CATHETERIZATION     HERNIA REPAIR     IR ANGIO INTRA EXTRACRAN SEL COM CAROTID INNOMINATE BILAT MOD SED  09/24/2022   IR ANGIO VERTEBRAL SEL SUBCLAVIAN INNOMINATE UNI R MOD SED  09/24/2022   IR ANGIO VERTEBRAL SEL VERTEBRAL UNI L MOD SED  09/24/2022   IR ANGIO VERTEBRAL SEL VERTEBRAL UNI L MOD SED  10/11/2022   IR CT HEAD LTD  10/11/2022   IR INTRA CRAN STENT  10/11/2022   IR RADIOLOGIST EVAL & MGMT  09/22/2022   IR RADIOLOGIST EVAL &  MGMT  09/30/2022   IR US GUIDE VASC ACCESS RIGHT  09/24/2022   KNEE SURGERY Right    RADIOLOGY WITH ANESTHESIA N/A 10/11/2022   Procedure: RPCA angioplasty with stenting;  Surgeon: Julieanne Cotton, MD;  Location: [facility] OR;  Service: Radiology;  Laterality: N/A;   SHOULDER SURGERY Left     Allergies: Patient has no known allergies.  Medications: Prior to Admission medications   Medication Sig Start Date End Date Taking? Authorizing Provider  aspirin EC 81 MG tablet Take 81 mg by mouth daily. Swallow whole.    [provider]  Coenzyme Q10 200 MG capsule Take 200 mg by mouth daily.    [provider]  COLLAGEN PO Take 2 Scoops by mouth daily.    [provider]  ELDERBERRY PO Take 50 mg by mouth daily.    [provider]  hydrochlorothiazide (MICROZIDE) 12.5 MG capsule Take 1 capsule (12.5 mg total) by mouth daily. 08/24/22   Perlie Gold, PA-C  losartan (COZAAR) 100 MG tablet Take 1 tablet (100 mg total) by mouth daily. 08/24/22   Perlie Gold, PA-C  Multiple Vitamin (MULTIVITAMIN) tablet Take 1 tablet by mouth daily.    [provider]  rosuvastatin (CRESTOR) 40 MG tablet Take 1 tablet (40 mg total) by mouth daily. 09/17/22     sildenafil (VIAGRA) 50 MG tablet Take  50 mg by mouth daily as needed for erectile dysfunction. 08/26/22   [provider]  ticagrelor (BRILINTA) 90 MG TABS tablet Take 1 tablet (90 mg total) by mouth 2 (two) times daily. 10/21/22   Micki Riley, MD  TURMERIC PO Take 1,500 mg by mouth daily.    [provider]     History reviewed. No pertinent family history.  Social History   Socioeconomic History   Marital status: Married    Spouse name: Not on file   Number of children: Not on file   Years of education: Not on file   Highest education level: Not on file  Occupational History   Not on file  Tobacco Use   Smoking status: Never   Smokeless tobacco: Never  Vaping Use   Vaping Use: Never used   Substance and Sexual Activity   Alcohol use: Not Currently    Comment: occasional   Drug use: Never   Sexual activity: Not on file  Other Topics Concern   Not on file  Social History Narrative   Caffeine occasional coffee   Education: masters.   Retired. From ministry.    Social Determinants of Health   Financial Resource Strain: Not on file  Food Insecurity: Not on file  Transportation Needs: Not on file  Physical Activity: Not on file  Stress: Not on file  Social Connections: Not on file    Review of Systems: A 12 point ROS discussed and pertinent positives are indicated in the HPI above.  All other systems are negative.  Review of Systems  Constitutional:  Negative for activity change, fatigue and fever.  HENT:  Negative for hearing loss, tinnitus and trouble swallowing.   Eyes:  Negative for visual disturbance.  Respiratory:  Negative for cough and shortness of breath.   Cardiovascular:  Negative for chest pain.  Gastrointestinal:  Negative for abdominal pain.  Musculoskeletal:  Negative for back pain and gait problem.  Neurological:  Negative for dizziness, tremors, seizures, syncope, facial asymmetry, speech difficulty, weakness, light-headedness, numbness and headaches.  Psychiatric/Behavioral:  Negative for behavioral problems and confusion.     Vital Signs: There were no vitals taken for this visit.  Advance Care Plan: The advanced care plan/surrogate decision maker was discussed at the time of visit and documented in the medical record.    Physical Exam Vitals reviewed.  HENT:     Mouth/Throat:     Mouth: Mucous membranes are moist.  Eyes:     Extraocular Movements: Extraocular movements intact.  Cardiovascular:     Rate and Rhythm: Normal rate and regular rhythm.     Heart sounds: Normal heart sounds.  Pulmonary:     Effort: Pulmonary effort is normal.     Breath sounds: Normal breath sounds.  Abdominal:     Palpations: Abdomen is soft.      Tenderness: There is no abdominal tenderness.  Musculoskeletal:        General: Normal range of motion.     Cervical back: Normal range of motion.     Right lower leg: No edema.     Left lower leg: No edema.  Skin:    General: Skin is warm.  Neurological:     Mental Status: He is alert and oriented to person, place, and time.  Psychiatric:        Behavior: Behavior normal.     Imaging: No results found.  Labs:  CBC: Recent Labs    09/24/22 0718 10/11/22 1610 10/12/22  1610 10/12/22 1640 11/15/22 0601  WBC 4.3 4.6 8.4  --  4.2  HGB 14.3 14.2 12.5* 8.5* 13.7  HCT 40.4 40.8 34.9* 25.0* 41.2  PLT 153 155 164  --  147*    COAGS: Recent Labs    09/18/22 1503 09/24/22 0718 10/11/22 0613 11/15/22 0601  INR 1.1 1.0 1.0 1.0  APTT 30  --   --   --     BMP: Recent Labs    09/24/22 0718 10/11/22 0613 10/12/22 0508 10/12/22 1640 11/15/22 0601  NA 142 141 136 136 143  K 3.5 3.3* 3.0* 4.2 3.4*  CL 106 104 101  --  106  CO2 26 25 24   --  24  GLUCOSE 110* 98 128*  --  103*  BUN 16 18 22   --  22  CALCIUM 9.0 9.2 8.5*  --  9.2  CREATININE 1.07 1.06 1.24  --  1.02  GFRNONAA >60 >60 >60  --  >60    LIVER FUNCTION TESTS: Recent Labs    09/18/22 1503  BILITOT 0.6  AST 33  ALT 34  ALKPHOS 68  PROT 6.7  ALBUMIN 4.3    TUMOR MARKERS: No results for input(s): "AFPTM", "CEA", "CA199", "CHROMGRNA" in the last 8760 hours.  Assessment and Plan:  Scheduled today for Right Posterior Cerebral Artery angioplasty/stent placement Risks and benefits of cerebral angiogram with intervention were discussed with the patient including, but not limited to bleeding, infection, vascular injury, contrast induced renal failure, stroke or even death.  This interventional procedure involves the use of X-rays and because of the nature of the planned procedure, it is possible that we will have prolonged use of X-ray fluoroscopy.  Potential radiation risks to you include (but are not  limited to) the following: - A slightly elevated risk for cancer  several years later in life. This risk is typically less than 0.5% percent. This risk is low in comparison to the normal incidence of human cancer, which is 33% for women and 50% for men according to the American Cancer Society. - Radiation induced injury can include skin redness, resembling a rash, tissue breakdown / ulcers and hair loss (which can be temporary or permanent).   The likelihood of either of these occurring depends on the difficulty of the procedure and whether you are sensitive to radiation due to previous procedures, disease, or genetic conditions.   IF your procedure requires a prolonged use of radiation, you will be notified and given written instructions for further action.  It is your responsibility to monitor the irradiated area for the 2 weeks following the procedure and to notify your physician if you are concerned that you have suffered a radiation induced injury.    All of the patient's questions were answered, patient is agreeable to proceed.  Consent signed and in chart  Thank you for this interesting consult.  I greatly enjoyed meeting JAYKUB MACKINS and look forward to participating in their care.  A copy of this report was sent to the requesting provider on this date.  Electronically Signed: Robet Leu, PA-C 11/15/2022, 7:16 AM   I spent a total of    25 Minutes in face to face in clinical consultation, greater than 50% of which was counseling/coordinating care for R PCA intervention

## 2022-11-15 NOTE — Progress Notes (Signed)
Patient reports that he has been entirely asymptomatic in the interval from attempted right PCA angioplasty/stent placement on 10/11/22. On discussion with Dr Corliss Skains this morning, it is felt that it is best to not proceed with scheduled angioplasty this morning, and to continue current treatment with ASA 81mg  QD and Brilinta 90 mg BID. CTA Head and Neck to be scheduled for mid-September per Dr Corliss Skains. Order for imaging has been placed, patient has 3 refills of Brilinta remaining at this time.  Kennieth Francois, PA-C 11/15/2022 8:31 AM

## 2022-11-15 NOTE — Progress Notes (Signed)
AFter patient spoke with Dr. Corliss Skains the procedure was cancelled. IV removed and patient along with his wife was escorted back to lobby for discharge home.

## 2022-11-25 ENCOUNTER — Ambulatory Visit: Payer: HMO | Attending: Neurology

## 2022-11-25 DIAGNOSIS — I639 Cerebral infarction, unspecified: Secondary | ICD-10-CM

## 2022-11-25 DIAGNOSIS — I491 Atrial premature depolarization: Secondary | ICD-10-CM

## 2022-11-27 ENCOUNTER — Telehealth: Payer: Self-pay | Admitting: Neurology

## 2022-11-27 NOTE — Telephone Encounter (Signed)
Please call him (see phone note)  heart monitor for 30 days showed No AFib or sustained arrhythmias, you have Supraventricular ectopic (SVE): these beats are a common type of cardiac event that can include premature atrial contractions (PACs) and runs of supraventricular tachycardia (SVT). SVEs are usually considered benign if they are infrequent and don't cause symptoms.I will reach out to cardiologist and see if this is something we need to further explore. I also still would like a loop recorder, I will ask my team to ensure you also still in favor and if so we will send you. Thanks     Patient monitoring period was from 10/22/22-11/20/22   No AFib or sustained arrhythmias   Occasional VE (2%), frequent SVE (7%)   There were 3 runs of nonsustained VT with longest lasting 6s   No patient triggered events

## 2022-11-29 ENCOUNTER — Ambulatory Visit (HOSPITAL_COMMUNITY): Payer: HMO

## 2022-11-29 NOTE — Telephone Encounter (Signed)
I called the patient and LVM (OK per DPR) asking for call back at his earliest convenience to discuss cardiac monitor results and the next steps. Left office number in message for call back.

## 2022-11-30 NOTE — Telephone Encounter (Signed)
Called LMVM for pt to call back at his convenience.

## 2022-12-01 NOTE — Telephone Encounter (Signed)
Append Comments   Seen  No AFib or sustained arrhythmias, you have Supraventricular ectopic (SVE): these beats are a common type of cardiac event that can include premature atrial contractions (PACs) and runs of supraventricular tachycardia (SVT). SVEs are usually considered benign if they are infrequent and don't cause symptoms.I will reach out to cardiologist and see if this is something we need to further explore. I also still would like a loop recorder, I will ask my team to ensure you also still in favor and if so we will send you. thanks Written by Anson Fret, MD on 11/27/2022  2:33 PM EDT Seen by patient [name] on 11/27/2022  7:22 PM

## 2023-01-20 ENCOUNTER — Encounter: Payer: Self-pay | Admitting: Neurology

## 2023-01-20 ENCOUNTER — Telehealth: Payer: Self-pay | Admitting: Neurology

## 2023-01-20 NOTE — Telephone Encounter (Signed)
LVM, sent mychart msg, sent cx letter informing pt of r/s needed for 12/26 appt- office closing.

## 2023-02-10 ENCOUNTER — Other Ambulatory Visit: Payer: Self-pay | Admitting: Neurology

## 2023-02-22 ENCOUNTER — Ambulatory Visit (HOSPITAL_COMMUNITY)
Admission: RE | Admit: 2023-02-22 | Discharge: 2023-02-22 | Disposition: A | Discharge status: Home / Self Care | Payer: HMO | Source: Ambulatory Visit | Attending: Interventional Radiology | Admitting: Interventional Radiology

## 2023-02-22 DIAGNOSIS — I708 Atherosclerosis of other arteries: Secondary | ICD-10-CM | POA: Diagnosis not present

## 2023-02-22 DIAGNOSIS — G450 Vertebro-basilar artery syndrome: Secondary | ICD-10-CM | POA: Insufficient documentation

## 2023-02-22 DIAGNOSIS — I672 Cerebral atherosclerosis: Secondary | ICD-10-CM | POA: Diagnosis not present

## 2023-02-22 DIAGNOSIS — I771 Stricture of artery: Secondary | ICD-10-CM | POA: Diagnosis not present

## 2023-02-22 DIAGNOSIS — I6621 Occlusion and stenosis of right posterior cerebral artery: Secondary | ICD-10-CM | POA: Diagnosis not present

## 2023-02-22 MED ORDER — IOHEXOL 350 MG/ML SOLN
80.0000 mL | Freq: Once | INTRAVENOUS | Status: AC | PRN
  Administered 2023-02-22: 80 mL via INTRAVENOUS

## 2023-02-22 MED ORDER — SODIUM CHLORIDE (PF) 0.9 % IJ SOLN
INTRAMUSCULAR | Status: AC
  Filled 2023-02-22: qty 50

## 2023-03-03 ENCOUNTER — Institutional Professional Consult (permissible substitution): Payer: HMO | Admitting: Neurology

## 2023-03-04 DIAGNOSIS — I498 Other specified cardiac arrhythmias: Secondary | ICD-10-CM | POA: Diagnosis not present

## 2023-03-04 DIAGNOSIS — E7800 Pure hypercholesterolemia, unspecified: Secondary | ICD-10-CM | POA: Diagnosis not present

## 2023-03-04 DIAGNOSIS — I6529 Occlusion and stenosis of unspecified carotid artery: Secondary | ICD-10-CM | POA: Diagnosis not present

## 2023-03-04 DIAGNOSIS — I10 Essential (primary) hypertension: Secondary | ICD-10-CM | POA: Diagnosis not present

## 2023-03-04 DIAGNOSIS — R202 Paresthesia of skin: Secondary | ICD-10-CM | POA: Diagnosis not present

## 2023-03-04 DIAGNOSIS — I771 Stricture of artery: Secondary | ICD-10-CM | POA: Diagnosis not present

## 2023-03-31 ENCOUNTER — Telehealth (HOSPITAL_COMMUNITY): Payer: Self-pay

## 2023-03-31 NOTE — Telephone Encounter (Signed)
Pt agreed to continue Brilinta per Dr. Corliss Skains. Wil revisit with Dr. Corliss Skains after next scan. AB

## 2023-03-31 NOTE — Telephone Encounter (Signed)
Pt agreed to f/u in 6 months with a cta head/neck. He wants to know if he should continue his Brilinta. I've sent a message to our neuro PA Beckey Downing) to advise. AB

## 2023-05-13 ENCOUNTER — Other Ambulatory Visit: Payer: Self-pay | Admitting: Neurology

## 2023-06-02 ENCOUNTER — Ambulatory Visit: Payer: HMO | Admitting: Neurology

## 2023-06-16 ENCOUNTER — Other Ambulatory Visit (HOSPITAL_COMMUNITY): Payer: Self-pay

## 2023-06-16 ENCOUNTER — Ambulatory Visit (INDEPENDENT_AMBULATORY_CARE_PROVIDER_SITE_OTHER): Payer: HMO | Admitting: Neurology

## 2023-06-16 ENCOUNTER — Encounter: Payer: Self-pay | Admitting: Neurology

## 2023-06-16 VITALS — BP 131/61 | HR 60 | Ht 71.0 in | Wt 211.4 lb

## 2023-06-16 DIAGNOSIS — R202 Paresthesia of skin: Secondary | ICD-10-CM | POA: Diagnosis not present

## 2023-06-16 DIAGNOSIS — I669 Occlusion and stenosis of unspecified cerebral artery: Secondary | ICD-10-CM | POA: Diagnosis not present

## 2023-06-16 MED ORDER — GABAPENTIN 100 MG PO CAPS
100.0000 mg | ORAL_CAPSULE | Freq: Two times a day (BID) | ORAL | 3 refills | Status: DC
  Filled 2023-06-16: qty 60, 30d supply, fill #0

## 2023-06-16 NOTE — Progress Notes (Signed)
 [facility] [address] [place]. [place] [address] [phone]       OFFICE FOLLOW-UP VISIT NOTE  Brian. [name] Date of Birth:  1951/04/26 Medical Record Number:  [id number]   Referring MD:  Dr Ines  Reason for Referral: Second opinion  HPI initial visit 10/21/2022:[name] is a pleasant 73 year old Caucasian male seen today for initial office visit for consultation for TIA intracranial stenosis.  He is accompanied by his wife.  History is obtained from them and review of electronic medical records as well as records from [facility] in [place]  city and I personally reviewed pertinent available imaging films in PACS.  He has past medical history of hypertension, hyperlipidemia, coronary artery disease, degenerative lumbar spine disease, mild obesity and spinal stenosis.  Patient was visiting [place] 09/03/2022 he had multiple episodes of transient left-sided vision loss.  He stated that his left-sided visual field became dark suddenly without warning or any associated symptoms.  This lasted less than few minutes and resolved.  Happened 3 times.  He presented to the [facility] in [place] where he was admitted.  CT head was unremarkable and CT angiogram of the head and neck showed severe near occlusive right P2 segment PCA stenosis.  There was fenestration of the proximal right MCA M1 segment with mild stenosis.  There is also mild stenosis of the left PCA in the P2 segment.  There is diminished visualization of the right vertebral artery in the V2 segment.  Patient was started on dual antiplatelet therapy.  LDL cholesterol was 70.2.  Echocardiogram showed ejection fraction of 55%.  There is no left atrial enlargement.  MRI scan of the brain showed punctate tiny acute infarct in the right putamen.  There is mild generalized atrophy and changes of small vessel disease.  Patient continues to have recurrent transient episodes of left eye hemifield  vision loss and was referred by her primary care physician to Dr. Dolphus who did a repeat CT angiogram on 09/18/2022 which confirmed severe high-grade right P2 segment PCA stenosis.  Diagnostic cerebral catheter angiogram on 09/27/2022 confirmed high-grade stenosis of the right posterior cerebral artery in the P2 segment with 75% stenosis of proximal right subclavian artery with poststenotic dilatation.  There was mild to moderate stenosis of the right posterior cerebral artery proximally.  After discussion of risk benefits of angioplasty stenting.  She will return on 10/11/2022 for elective angioplasty stenting but procedure was unsuccessful as the balloon could not reach the area of narrowing and the catheter was not long enough.  Dr. Dolphus plans to reattempt the procedure after and order a catheter which is long enough to reach that.  Patient continues to have these recurrent episodes since he states he is tired about 20 of these episodes in the last 40 days..  The episodes are stereotypical.  There is no obvious triggers.  Episodes appear more prominent when patient is tired or does some physical exertion.  He denies accompanying headache, double vision, vertigo, numbness, weakness, gait or balance problems.  Denies any known prior history of strokes or TIAs.  He has prior history of chronic right foot drop from degenerative spine disease. Update 06/16/2023 : He returns for follow-up after last visit 7 months ago.  He states he is doing well.  He has had no recurrent stroke or TIA symptoms.  Remains on aspirin  and Brilinta  which he is tolerating well with without bruising and bleeding.  He is tolerating Crestor  well  without muscle aches and pains.  He did have follow-up CT angiogram done on 02/22/2023 which actually showed slightly improved appearance of the moderate to severe right PCA stenosis and moderate left P2 irregularity which is stable.  Stable high-grade right subclavian origin stenosis of 75%.   Patient is bothered by his severe right knee arthritis and plans to have knee replacement soon.  He is complaining of increasing discomfort and paresthesias in his feet from his chronic peripheral neuropathy.  He was on gabapentin  in the past several years ago but for unclear reason he discontinued it.  He is willing to try it again. ROS:   14 system review of systems is positive for vision loss only all other systems negative  PMH:  Past Medical History:  Diagnosis Date   COVID    DJD (degenerative joint disease) of knee    Dyspnea on exertion    Hearing loss    Hypercholesteremia    Hypertension    Overweight    Paresthesias    Personal history of colonic polyps    Primary osteoarthritis of both knees    Skin mole    Spinal stenosis    Stenosis of right subclavian artery (HCC)    Stroke (HCC)     Social History:  Social History   Socioeconomic History   Marital status: Married    Spouse name: Not on file   Number of children: Not on file   Years of education: Not on file   Highest education level: Not on file  Occupational History   Not on file  Tobacco Use   Smoking status: Never   Smokeless tobacco: Never  Vaping Use   Vaping status: Never Used  Substance and Sexual Activity   Alcohol use: Not Currently    Comment: occasional   Drug use: Never   Sexual activity: Not on file  Other Topics Concern   Not on file  Social History Narrative   Caffeine occasional coffee   Education: masters.   Retired. From ministry.    Social Drivers of Corporate Investment Banker Strain: Not on file  Food Insecurity: Not on file  Transportation Needs: Not on file  Physical Activity: Not on file  Stress: Not on file  Social Connections: Not on file  Intimate Partner Violence: Not on file    Medications:   Current Outpatient Medications on File Prior to Visit  Medication Sig Dispense Refill   BRILINTA  90 MG TABS tablet Take 1 tablet by mouth twice daily 180 tablet 0    Coenzyme Q10 200 MG capsule Take 200 mg by mouth daily.     COLLAGEN PO Take 2 Scoops by mouth daily.     ELDERBERRY PO Take 50 mg by mouth daily.     hydrochlorothiazide  (MICROZIDE ) 12.5 MG capsule Take 1 capsule (12.5 mg total) by mouth daily. 90 capsule 3   losartan  (COZAAR ) 100 MG tablet Take 1 tablet (100 mg total) by mouth daily. 90 tablet 3   Multiple Vitamin (MULTIVITAMIN) tablet Take 1 tablet by mouth daily.     rosuvastatin  (CRESTOR ) 40 MG tablet Take 1 tablet (40 mg total) by mouth daily. 90 tablet 1   sildenafil (VIAGRA) 50 MG tablet Take 50 mg by mouth daily as needed for erectile dysfunction.     TURMERIC PO Take 1,500 mg by mouth daily.     No current facility-administered medications on file prior to visit.    Allergies:  No Known Allergies  Physical Exam  General: well developed, well nourished pleasant elderly Caucasian male, seated, in no evident distress Head: head normocephalic and atraumatic.   Neck: supple with no carotid or supraclavicular bruits Cardiovascular: regular rate and rhythm, no murmurs Musculoskeletal: no deformity Skin:  no rash/petichiae Vascular:  Normal pulses all extremities  Neurologic Exam Mental Status: Awake and fully alert. Oriented to place and time. Recent and remote memory intact. Attention span, concentration and fund of knowledge appropriate. Mood and affect appropriate.  Cranial Nerves: Fundoscopic exam reveals sharp disc margins. Pupils equal, briskly reactive to light. Extraocular movements full without nystagmus. Visual fields full to confrontation. Hearing intact. Facial sensation intact. Face, tongue, palate moves normally and symmetrically.  Motor: Normal bulk and tone. Normal strength in all tested extremity muscles except right foot drop with ankle dorsiflexor weakness.. Sensory.: intact to touch , pinprick , position and vibratory sensation.  Subjective paresthesias in both feet but no objective sensory loss Coordination: Rapid  alternating movements normal in all extremities. Finger-to-nose and heel-to-shin performed accurately bilaterally. Gait and Station: Arises from chair without difficulty. Stance is normal. Gait demonstrates normal stride length and balance and walks with a right foot drop.. Able to heel, toe and tandem walk with slight difficulty.  Reflexes: 1+ and symmetric. Toes downgoing.   NIHSS  0 Modified Rankin  1   ASSESSMENT: 73 year old Caucasian male with recurrent episodes of transient left sided hemianopsia March 2024 likely due to symptomatic right P2 segment PCA high-grade stenosis.  MRI also showed silent small right basal ganglia lacunar infarct.  Vascular risk factors of hypertension, hyperlipidemia, intracranial stenosis, mild obesity and at risk for sleep apnea History of right foot drop from degenerative lumbar spine disease.    PLAN:I had a long d/w patient about his recent recurrent left eye episodes of transient vision loss likely TIA symptomatic from his high-grade right posterior cerebral artery stenosis, silent right basal ganglia lacunar infarct, risk for recurrent stroke/TIAs, personally independently reviewed imaging studies and stroke evaluation results and answered questions.recommend he continue   Brilinta  but now discontinue aspirin  as it has been more than 3 months for secondary stroke prevention for 3 months and  maintain strict control of hypertension with blood pressure goal below 130/90, diabetes with hemoglobin A1c goal below 6.5% and lipids with LDL cholesterol goal below 70 mg/dL. I also advised the patient to eat a healthy diet with plenty of whole grains, cereals, fruits and vegetables, exercise regularly and maintain ideal body weight .  Treatment trial of gabapentin  100 mg twice daily to help with his feet paresthesias and may increase to 3 times daily if necessary.  Follow-up in the future with Dr. Ines.  No scheduled follow-up with me is necessary.  Greater than 50% time  during this 35-minute visit was spent in counseling and coordination of care about his TIA intracranial stenosis and feet paresthesias and answering questions. Eather Popp, MD  Note: This document was prepared with digital dictation and possible smart phrase technology. Any transcriptional errors that result from this process are unintentional.

## 2023-06-16 NOTE — Patient Instructions (Addendum)
 I had a long d/w patient about his recent recurrent left eye episodes of transient vision loss likely TIA symptomatic from his high-grade right posterior cerebral artery stenosis, silent right basal ganglia lacunar infarct, risk for recurrent stroke/TIAs, personally independently reviewed imaging studies and stroke evaluation results and answered questions.recommend he continue   Brilinta  but now discontinue aspirin  as it has been more than 3 months for secondary stroke prevention for 3 months and  maintain strict control of hypertension with blood pressure goal below 130/90, diabetes with hemoglobin A1c goal below 6.5% and lipids with LDL cholesterol goal below 70 mg/dL. I also advised the patient to eat a healthy diet with plenty of whole grains, cereals, fruits and vegetables, exercise regularly and maintain ideal body weight .  Treatment trial of gabapentin  100 mg twice daily to help with his feet paresthesias and may increase to 3 times daily if necessary.  Follow-up in the future with Dr. Darleen.  No scheduled follow-up with me is necessary.

## 2023-06-21 ENCOUNTER — Telehealth: Payer: Self-pay | Admitting: Cardiology

## 2023-06-21 ENCOUNTER — Telehealth: Payer: Self-pay | Admitting: *Deleted

## 2023-06-21 NOTE — Telephone Encounter (Signed)
 Pt has been scheduled tele preop appt 08/30/23. Med rec and consent are done.     Patient Consent for Virtual Visit        [name] has provided verbal consent on 06/21/2023 for a virtual visit (video or telephone).   CONSENT FOR VIRTUAL VISIT FOR:  [name]  By participating in this virtual visit I agree to the following:  I hereby voluntarily request, consent and authorize [facility] and its employed or contracted physicians, physician assistants, nurse practitioners or other licensed health care professionals (the Practitioner), to provide me with telemedicine health care services (the "Services) as deemed necessary by the treating Practitioner. I acknowledge and consent to receive the Services by the Practitioner via telemedicine. I understand that the telemedicine visit will involve communicating with the Practitioner through live audiovisual communication technology and the disclosure of certain medical information by electronic transmission. I acknowledge that I have been given the opportunity to request an in-person assessment or other available alternative prior to the telemedicine visit and am voluntarily participating in the telemedicine visit.  I understand that I have the right to withhold or withdraw my consent to the use of telemedicine in the course of my care at any time, without affecting my right to future care or treatment, and that the Practitioner or I may terminate the telemedicine visit at any time. I understand that I have the right to inspect all information obtained and/or recorded in the course of the telemedicine visit and may receive copies of available information for a reasonable fee.  I understand that some of the potential risks of receiving the Services via telemedicine include:  Delay or interruption in medical evaluation due to technological equipment failure or disruption; Information transmitted may not be sufficient (e.g. poor  resolution of images) to allow for appropriate medical decision making by the Practitioner; and/or  In rare instances, security protocols could fail, causing a breach of personal health information.  Furthermore, I acknowledge that it is my responsibility to provide information about my medical history, conditions and care that is complete and accurate to the best of my ability. I acknowledge that Practitioner's advice, recommendations, and/or decision may be based on factors not within their control, such as incomplete or inaccurate data provided by me or distortions of diagnostic images or specimens that may result from electronic transmissions. I understand that the practice of medicine is not an exact science and that Practitioner makes no warranties or guarantees regarding treatment outcomes. I acknowledge that a copy of this consent can be made available to me via my patient portal [facility] MyChart), or I can request a printed copy by calling the office of [facility].    I understand that my insurance will be billed for this visit.   I have read or had this consent read to me. I understand the contents of this consent, which adequately explains the benefits and risks of the Services being provided via telemedicine.  I have been provided ample opportunity to ask questions regarding this consent and the Services and have had my questions answered to my satisfaction. I give my informed consent for the services to be provided through the use of telemedicine in my medical care

## 2023-06-21 NOTE — Telephone Encounter (Signed)
   Name: [name]  DOB: 01-Feb-1951  MRN: [id number]  Primary Cardiologist: Formerly Dr. Hobart. Needs to be established   Preoperative team, please contact this patient and set up a phone call appointment for further preoperative risk assessment. Please obtain consent and complete medication review. Thank you for your help.  I confirm that guidance regarding antiplatelet and oral anticoagulation therapy has been completed and, if necessary, noted below.  Holding Brilinta  should be addressed by Dr. Rosemarie as he has prescribed this for high grade stenosis right posterior cerebral artery stenosis, with silent right basal ganglia lacunar infarct and risk for recurrent CVA/TIAs.  I also confirmed the patient [place] in the state of [place] . As per [facility] Medical Board telemedicine laws, the patient must reside in the state in which the provider is licensed.   Lamarr Satterfield, NP 06/21/2023, 1:27 PM [facility]

## 2023-06-21 NOTE — Telephone Encounter (Signed)
   Pre-operative Risk Assessment    Patient Name: [name]  DOB: January 11, 1951 MRN: [id number]   Date of last office visit: 10/22/2022 Date of next office visit: none   Request for Surgical Clearance    Procedure:   right total knee arthroplasty  Date of Surgery:  Clearance 09/28/23                                Surgeon:  Dr. Dempsey Moan Surgeon's Group or Practice Name:  [facility] Phone number:  [phone] Fax number:  [phone]   Type of Clearance Requested:   - Medical    Type of Anesthesia:   choice   Additional requests/questions:    SignedJonel LITTIE Bucco   06/21/2023, 10:00 AM

## 2023-06-21 NOTE — Telephone Encounter (Signed)
 Pt has been scheduled tele preop appt 08/30/23. Med rec and consent are done.

## 2023-06-22 ENCOUNTER — Institutional Professional Consult (permissible substitution): Payer: HMO | Admitting: Neurology

## 2023-07-14 ENCOUNTER — Ambulatory Visit: Payer: HMO

## 2023-07-14 NOTE — Telephone Encounter (Signed)
 Pt called back and has been scheduled to see Marlyse Single, [facility] 08/03/23. I will update all parties involved.

## 2023-07-14 NOTE — Telephone Encounter (Signed)
   Name: [name]  DOB: 12-14-1950  MRN: [id number]  Primary Cardiologist: None  Chart reviewed as part of pre-operative protocol coverage. Because of [name]'s past medical history and time since last visit, he will require a follow-up in-office visit in order to better assess preoperative cardiovascular risk.  Called and discussed with patient.  Patient had a stroke/TIA since last office visit in 08/2022.  Patient's procedure is currently scheduled for April 2025, discussed with patient that this would be over a year since he was last seen in office and based on guidelines he should have an in office visit.  Given timing of last office visit and stroke/TIA patient in agreement with an office visit.  Pre-op covering staff: - Please schedule appointment and call patient to inform them. If patient already had an upcoming appointment within acceptable timeframe, please add pre-op clearance to the appointment notes so provider is aware. - Please contact requesting surgeon's office via preferred method (i.e, phone, fax) to inform them of need for appointment prior to surgery.  Holding Brilinta  should be addressed by Dr. Rosemarie as he has prescribed this for high grade stenosis right posterior cerebral artery stenosis, with silent right basal ganglia lacunar infarct and risk for recurrent CVA/TIAs.    Brian Byrnes D Venia Riveron, NP  07/14/2023, 3:28 PM

## 2023-07-14 NOTE — Telephone Encounter (Signed)
 Left message per preop APP Rosabel Mose, NP:   Genna I talked to him, he was actually rather nice about it. His procedure is still scheduled for the end of April so we talked that this would only work for 2 months and that I would recommend an in office visit. He is trying to get it moved up however he agreed with an in office appointment either the last week of February or the first week of March as that would still be within the 27-month timeframe if his procedure is not moved forward. Would it be better to cancel the appointment from today and reschedule it and then I can send a separate letter stating that he will need an in office appointment?    I cancelled the tele preop appt today per preop APP today. Left message for the pt to call back to schedule in office appt the end Feb 2025 or the beginning of March 2025 per preop APP.   I will update the requesting office as well.

## 2023-07-14 NOTE — Progress Notes (Unsigned)
 {Choose 1 Note Type (Telehealth Visit or Telephone Visit):[phone]}  Evaluation Performed:  Preoperative cardiovascular risk assessment _____________   Date:  07/14/2023   Patient ID:  [name], DOB 12/02/1950, MRN [id number] Patient Location:  Home Provider location:   Office  Primary Care Provider:  Rolinda Millman, MD Primary Cardiologist:  None  Chief Complaint / Patient Profile   73 y.o. y/o male with a h/o *** who is pending *** and presents today for telephonic preoperative cardiovascular risk assessment.  History of Present Illness    [name] is a 73 y.o. male who presents via audio/video conferencing for a telehealth visit today.  Pt was last seen in cardiology clinic on *** by ***.  At that time [name] was doing well ***.  The patient is now pending procedure as outlined above. Since his last visit, he ***  Past Medical History    Past Medical History:  Diagnosis Date   COVID    DJD (degenerative joint disease) of knee    Dyspnea on exertion    Hearing loss    Hypercholesteremia    Hypertension    Overweight    Paresthesias    Personal history of colonic polyps    Primary osteoarthritis of both knees    Skin mole    Spinal stenosis    Stenosis of right subclavian artery (HCC)    Stroke [facility])    Past Surgical History:  Procedure Laterality Date   APPENDECTOMY     CARDIAC CATHETERIZATION     HERNIA REPAIR     IR ANGIO INTRA EXTRACRAN SEL COM CAROTID INNOMINATE BILAT MOD SED  09/24/2022   IR ANGIO VERTEBRAL SEL SUBCLAVIAN INNOMINATE UNI R MOD SED  09/24/2022   IR ANGIO VERTEBRAL SEL VERTEBRAL UNI L MOD SED  09/24/2022   IR ANGIO VERTEBRAL SEL VERTEBRAL UNI L MOD SED  10/11/2022   IR CT HEAD LTD  10/11/2022   IR INTRA CRAN STENT  10/11/2022   IR RADIOLOGIST EVAL & MGMT  09/22/2022   IR RADIOLOGIST EVAL & MGMT  09/30/2022   IR US  GUIDE VASC ACCESS RIGHT  09/24/2022   KNEE SURGERY Right    RADIOLOGY WITH ANESTHESIA N/A 10/11/2022   Procedure:  RPCA angioplasty with stenting;  Surgeon: Dolphus Carrion, MD;  Location: [facility] OR;  Service: Radiology;  Laterality: N/A;   SHOULDER SURGERY Left     Allergies  No Known Allergies  Home Medications    Prior to Admission medications   Medication Sig Start Date End Date Taking? Authorizing Provider  BRILINTA  90 MG TABS tablet Take 1 tablet by mouth twice daily 05/17/23   Sethi, Pramod S, MD  Coenzyme Q10 200 MG capsule Take 200 mg by mouth daily.    [provider]  COLLAGEN PO Take 2 Scoops by mouth daily.    [provider]  ELDERBERRY PO Take 50 mg by mouth daily.    [provider]  gabapentin  (NEURONTIN ) 100 MG capsule Take 1 capsule (100 mg total) by mouth 2 (two) times daily. 06/16/23   Rosemarie Eather RAMAN, MD  hydrochlorothiazide  (MICROZIDE ) 12.5 MG capsule Take 1 capsule (12.5 mg total) by mouth daily. 08/24/22   Trudy Birmingham, PA-C  losartan  (COZAAR ) 100 MG tablet Take 1 tablet (100 mg total) by mouth daily. 08/24/22   Williams, Evan, PA-C  Multiple Vitamin (MULTIVITAMIN) tablet Take 1 tablet by mouth daily.    [provider]  rosuvastatin  (CRESTOR ) 40 MG tablet Take 1 tablet (  40 mg total) by mouth daily. 09/17/22     sildenafil (VIAGRA) 50 MG tablet Take 50 mg by mouth daily as needed for erectile dysfunction. Patient not taking: Reported on 06/21/2023 08/26/22   [provider]  TURMERIC PO Take 1,500 mg by mouth daily.    [provider]    Physical Exam    Vital Signs:  [name] does not have vital signs available for review today.***  Given telephonic nature of communication, physical exam is limited. AAOx3. NAD. Normal affect.  Speech and respirations are unlabored.  Accessory Clinical Findings    None  Assessment & Plan    1.  Preoperative Cardiovascular Risk Assessment:  The patient was advised that if he develops new symptoms prior to surgery to contact our office to arrange for a follow-up visit, and he  verbalized understanding.  (Reminder: Include SBE prophylaxis/Antiplatelet/Anticoag Instructions***)  A copy of this note will be routed to requesting surgeon.  Time:   Today, I have spent *** minutes with the patient with telehealth technology discussing medical history, symptoms, and management plan.     Pallie Swigert D Altie Savard, NP  07/14/2023, 3:27 PM

## 2023-08-02 NOTE — Progress Notes (Signed)
 "     Cardiology Office Note:    Date:  08/03/2023  ID:  [name], DOB Dec 18, 1950, MRN [id number] PCP: Rolinda Millman, MD  [facility] Providers Cardiologist:  Stanly DELENA Leavens, MD       Patient Profile:      Coronary artery disease  CCTA 09/18/20: CAC score 170 (50th percentile); [pRCA < 24; pLAD 25-49] TTE 10/02/20: EF 50-55, no RWMA, GLS -22.2, NL RVSF, trivial MR, RAP 3  Hypertension  Hyperlipidemia  Hx of CVA, TIAs Symptomatic right P2 PCA stenosis S/p unsuccessful intervention R PCA Ticagrelor  managed by Dr. Rosemarie (Neuro) R subclavian stenosis US  07/12/2018 Aortic atherosclerosis           Discussed the use of AI scribe software for clinical note transcription with the patient, who gave verbal consent to proceed. History of Present Illness [name] is a 73 y.o. male who returns for surgical clearance. He is a prior patient of Dr. Hobart. He was last seen in clinic in 08/2022. He needs to undergo R TKR on 09/28/23 with Dr. Melodi (type of anesthesia unknown). Of note, his Ticagrelor  is managed by Neuro given his hx of symptomatic R PCA stenosis and unsuccessful prior attempt at percutaneous revascularization.   He experiences knee pain and manages it with daily application of Voltaren and wearing braces on both knees. Despite the pain, he remains active, engaging in activities such as playing golf, walking up steps, and walking a block or two without needing to stop. He experiences shortness of breath during exertion, such as walking up his driveway, swimming, and singing. This has been ongoing for a couple of years without worsening. No associated chest pain, jaw pain, arm pain, back pain, or leg swelling. He sleeps well without orthopnea.  ROS-See HPI    Studies Reviewed:   EKG Interpretation Date/Time:  Wednesday August 03 2023 08:55:18 EST Ventricular Rate:  79 PR Interval:  150 QRS Duration:  90 QT Interval:  366 QTC Calculation: 419 R  Axis:   54  Text Interpretation: Sinus rhythm with Premature atrial complexes in a pattern of bigeminy Nonspecific T wave abnormality No significant change since last tracing Confirmed by Lelon Hamilton [phone]) on 08/03/2023 8:58:08 AM    Results    Risk Assessment/Calculations:             Physical Exam:   VS:  BP 118/68   Pulse 79   Ht 5' 10.5 (1.791 m)   Wt 208 lb 12.8 oz (94.7 kg)   SpO2 95%   BMI 29.54 kg/m    Wt Readings from Last 3 Encounters:  08/03/23 208 lb 12.8 oz (94.7 kg)  06/16/23 211 lb 6.4 oz (95.9 kg)  11/15/22 199 lb (90.3 kg)    Constitutional:      Appearance: Healthy appearance. Not in distress.  Neck:     Vascular: JVD normal.  Pulmonary:     Breath sounds: Normal breath sounds. No wheezing. No rales.  Cardiovascular:     Normal rate. Regular rhythm.     Murmurs: There is no murmur.  Edema:    Peripheral edema absent.  Abdominal:     Palpations: Abdomen is soft.        Assessment and Plan:   Assessment & Plan Preoperative cardiovascular examination [name] perioperative risk of a major cardiac event is 0.9% according to the Revised Cardiac Risk Index (RCRI).  Therefore, he is at low risk for perioperative complications.   His functional  capacity is good at 4.31 METs according to the Duke Activity Status Index (DASI). Recommendations:  According to ACC/AHA guidelines, no further cardiovascular testing needed.  The patient may proceed to surgery at acceptable risk.   Antiplatelet and/or Anticoagulation Recommendations:   Brilinta  management is per Neurology Coronary artery disease involving native coronary artery of native heart without angina pectoris Mild, nonobstructive CAD by CCTA in April 2022.  Echocardiogram April 2022 with EF 50-55 and trivial MR.  He is doing well without anginal symptoms.  He does note chronic dyspnea exertion.  This is unchanged of the past 2 years.  He does not require aspirin  as he is on Brilinta .   -Continue  rosuvastatin  40 mg daily. Primary hypertension Hypertension is well-controlled. - Continue hydrochlorothiazide  12.5 mg daily - Continue losartan  100 mg daily Mixed hyperlipidemia Managed by primary care. He remains on Rosuvastatin . Goal LDL for CAD is < 70. History of stroke Hx of R PCA stenosis managed medically by Neuro with Brilinta .       Dispo:  Return in about 1 year (around 08/02/2024) for Routine Follow Up, w/ Dr. Santo.  Signed, Glendia Ferrier, PA-C   "

## 2023-08-03 ENCOUNTER — Ambulatory Visit: Payer: HMO | Attending: Physician Assistant | Admitting: Physician Assistant

## 2023-08-03 ENCOUNTER — Encounter: Payer: Self-pay | Admitting: Physician Assistant

## 2023-08-03 VITALS — BP 118/68 | HR 79 | Ht 70.5 in | Wt 208.8 lb

## 2023-08-03 DIAGNOSIS — E782 Mixed hyperlipidemia: Secondary | ICD-10-CM | POA: Diagnosis not present

## 2023-08-03 DIAGNOSIS — Z01810 Encounter for preprocedural cardiovascular examination: Secondary | ICD-10-CM | POA: Diagnosis not present

## 2023-08-03 DIAGNOSIS — I10 Essential (primary) hypertension: Secondary | ICD-10-CM | POA: Diagnosis not present

## 2023-08-03 DIAGNOSIS — I2510 Atherosclerotic heart disease of native coronary artery without angina pectoris: Secondary | ICD-10-CM

## 2023-08-03 DIAGNOSIS — Z8673 Personal history of transient ischemic attack (TIA), and cerebral infarction without residual deficits: Secondary | ICD-10-CM

## 2023-08-03 NOTE — Assessment & Plan Note (Signed)
 Hypertension is well-controlled. - Continue hydrochlorothiazide 12.5 mg daily - Continue losartan 100 mg daily

## 2023-08-03 NOTE — Assessment & Plan Note (Signed)
 Managed by primary care. He remains on Rosuvastatin. Goal LDL for CAD is < 70.

## 2023-08-03 NOTE — Patient Instructions (Signed)
 Medication Instructions:  Your physician recommends that you continue on your current medications as directed. Please refer to the Current Medication list given to you today.  *If you need a refill on your cardiac medications before your next appointment, please call your pharmacy*   Lab Work: None ordered  If you have labs (blood work) drawn today and your tests are completely normal, you will receive your results only by: MyChart Message (if you have MyChart) OR A paper copy in the mail If you have any lab test that is abnormal or we need to change your treatment, we will call you to review the results.   Testing/Procedures: None ordered   Follow-Up: At [facility], you and your health needs are our priority.  As part of our continuing mission to provide you with exceptional heart care, we have created designated Provider Care Teams.  These Care Teams include your primary Cardiologist (physician) and Advanced Practice Providers (APPs -  Physician Assistants and Nurse Practitioners) who all work together to provide you with the care you need, when you need it.  We recommend signing up for the patient portal called "MyChart".  Sign up information is provided on this After Visit Summary.  MyChart is used to connect with patients for Virtual Visits (Telemedicine).  Patients are able to view lab/test results, encounter notes, upcoming appointments, etc.  Non-urgent messages can be sent to your provider as well.   To learn more about what you can do with MyChart, go to [email or web address].    Your next appointment:   12 month(s)  Provider:   Christell Constant, MD     Other Instructions     1st Floor: - Lobby - Registration  - Pharmacy  - Lab - Cafe  2nd Floor: - PV Lab - Diagnostic Testing (echo, CT, nuclear med)  3rd Floor: - Vacant  4th Floor: - TCTS (cardiothoracic surgery) - AFib Clinic - Structural Heart Clinic - Vascular Surgery  - Vascular  Ultrasound  5th Floor: - HeartCare Cardiology (general and EP) - Clinical Pharmacy for coumadin, hypertension, lipid, weight-loss medications, and med management appointments    Valet parking services will be available as well.

## 2023-08-04 NOTE — Telephone Encounter (Signed)
 Note faxed to surgeon. Tereso Newcomer, PA-C    08/04/2023 8:32 AM

## 2023-08-08 ENCOUNTER — Ambulatory Visit: Payer: HMO | Admitting: Neurology

## 2023-08-10 ENCOUNTER — Other Ambulatory Visit: Payer: Self-pay | Admitting: Neurology

## 2023-08-30 ENCOUNTER — Telehealth: Payer: HMO

## 2023-09-13 NOTE — Telephone Encounter (Signed)
 Jacki Cones with [facility] called in asking for notes to sent sent again   Fax: [phone]

## 2023-09-13 NOTE — Telephone Encounter (Signed)
 I will re-fax clearance note to [phone] to [facility].

## 2023-09-20 DIAGNOSIS — Z125 Encounter for screening for malignant neoplasm of prostate: Secondary | ICD-10-CM | POA: Diagnosis not present

## 2023-09-20 DIAGNOSIS — E7800 Pure hypercholesterolemia, unspecified: Secondary | ICD-10-CM | POA: Diagnosis not present

## 2023-09-20 DIAGNOSIS — M170 Bilateral primary osteoarthritis of knee: Secondary | ICD-10-CM | POA: Diagnosis not present

## 2023-09-20 DIAGNOSIS — Z0000 Encounter for general adult medical examination without abnormal findings: Secondary | ICD-10-CM | POA: Diagnosis not present

## 2023-09-20 DIAGNOSIS — R7303 Prediabetes: Secondary | ICD-10-CM | POA: Diagnosis not present

## 2023-09-20 DIAGNOSIS — Z23 Encounter for immunization: Secondary | ICD-10-CM | POA: Diagnosis not present

## 2023-09-20 DIAGNOSIS — Z79899 Other long term (current) drug therapy: Secondary | ICD-10-CM | POA: Diagnosis not present

## 2023-09-20 DIAGNOSIS — I10 Essential (primary) hypertension: Secondary | ICD-10-CM | POA: Diagnosis not present

## 2023-09-20 DIAGNOSIS — Z0184 Encounter for antibody response examination: Secondary | ICD-10-CM | POA: Diagnosis not present

## 2023-09-20 DIAGNOSIS — Z1331 Encounter for screening for depression: Secondary | ICD-10-CM | POA: Diagnosis not present

## 2023-09-20 DIAGNOSIS — I63539 Cerebral infarction due to unspecified occlusion or stenosis of unspecified posterior cerebral artery: Secondary | ICD-10-CM | POA: Diagnosis not present

## 2023-09-20 DIAGNOSIS — Z8673 Personal history of transient ischemic attack (TIA), and cerebral infarction without residual deficits: Secondary | ICD-10-CM | POA: Diagnosis not present

## 2023-09-21 ENCOUNTER — Other Ambulatory Visit (HOSPITAL_COMMUNITY): Payer: Self-pay | Admitting: Interventional Radiology

## 2023-09-21 DIAGNOSIS — I771 Stricture of artery: Secondary | ICD-10-CM

## 2023-09-23 ENCOUNTER — Other Ambulatory Visit (HOSPITAL_COMMUNITY): Payer: Self-pay

## 2023-09-23 MED ORDER — ASPIRIN 81 MG PO TBEC
81.0000 mg | DELAYED_RELEASE_TABLET | Freq: Every day | ORAL | 0 refills | Status: AC
  Filled 2023-09-23: qty 42, 42d supply, fill #0

## 2023-09-23 MED ORDER — OXYCODONE HCL 5 MG PO TABS
5.0000 mg | ORAL_TABLET | Freq: Four times a day (QID) | ORAL | 0 refills | Status: AC | PRN
  Filled 2023-09-23: qty 42, 6d supply, fill #0

## 2023-09-23 MED ORDER — METHOCARBAMOL 500 MG PO TABS
500.0000 mg | ORAL_TABLET | Freq: Four times a day (QID) | ORAL | 0 refills | Status: AC | PRN
  Filled 2023-09-23: qty 40, 10d supply, fill #0

## 2023-09-23 MED ORDER — TRAMADOL HCL 50 MG PO TABS
50.0000 mg | ORAL_TABLET | Freq: Four times a day (QID) | ORAL | 0 refills | Status: DC | PRN
  Filled 2023-09-23: qty 40, 5d supply, fill #0

## 2023-09-23 MED ORDER — ONDANSETRON HCL 4 MG PO TABS
4.0000 mg | ORAL_TABLET | Freq: Four times a day (QID) | ORAL | 0 refills | Status: DC | PRN
  Filled 2023-09-23: qty 20, 5d supply, fill #0

## 2023-09-24 ENCOUNTER — Other Ambulatory Visit (HOSPITAL_COMMUNITY): Payer: Self-pay

## 2023-09-27 ENCOUNTER — Encounter (HOSPITAL_COMMUNITY): Payer: Self-pay

## 2023-09-27 ENCOUNTER — Ambulatory Visit (HOSPITAL_COMMUNITY)
Admission: RE | Admit: 2023-09-27 | Discharge: 2023-09-27 | Disposition: A | Discharge status: Home / Self Care | Source: Ambulatory Visit | Attending: Interventional Radiology | Admitting: Interventional Radiology

## 2023-09-27 DIAGNOSIS — I6623 Occlusion and stenosis of bilateral posterior cerebral arteries: Secondary | ICD-10-CM | POA: Diagnosis not present

## 2023-09-27 DIAGNOSIS — I771 Stricture of artery: Secondary | ICD-10-CM | POA: Insufficient documentation

## 2023-09-27 DIAGNOSIS — I672 Cerebral atherosclerosis: Secondary | ICD-10-CM | POA: Diagnosis not present

## 2023-09-27 DIAGNOSIS — I708 Atherosclerosis of other arteries: Secondary | ICD-10-CM | POA: Diagnosis not present

## 2023-09-27 MED ORDER — IOHEXOL 350 MG/ML SOLN
75.0000 mL | Freq: Once | INTRAVENOUS | Status: AC | PRN
  Administered 2023-09-27: 75 mL via INTRAVENOUS

## 2023-09-27 MED ORDER — SODIUM CHLORIDE (PF) 0.9 % IJ SOLN
INTRAMUSCULAR | Status: AC
  Filled 2023-09-27: qty 50

## 2023-09-28 DIAGNOSIS — G8918 Other acute postprocedural pain: Secondary | ICD-10-CM | POA: Diagnosis not present

## 2023-09-28 DIAGNOSIS — M25761 Osteophyte, right knee: Secondary | ICD-10-CM | POA: Diagnosis not present

## 2023-09-28 DIAGNOSIS — M1711 Unilateral primary osteoarthritis, right knee: Secondary | ICD-10-CM | POA: Diagnosis not present

## 2023-09-30 DIAGNOSIS — G8918 Other acute postprocedural pain: Secondary | ICD-10-CM | POA: Diagnosis not present

## 2023-09-30 DIAGNOSIS — M25561 Pain in right knee: Secondary | ICD-10-CM | POA: Diagnosis not present

## 2023-10-03 ENCOUNTER — Other Ambulatory Visit (HOSPITAL_COMMUNITY): Payer: Self-pay

## 2023-10-03 DIAGNOSIS — G8918 Other acute postprocedural pain: Secondary | ICD-10-CM | POA: Diagnosis not present

## 2023-10-03 DIAGNOSIS — M25561 Pain in right knee: Secondary | ICD-10-CM | POA: Diagnosis not present

## 2023-10-03 MED ORDER — PANTOPRAZOLE SODIUM 40 MG PO TBEC
40.0000 mg | DELAYED_RELEASE_TABLET | Freq: Every day | ORAL | 0 refills | Status: AC
  Filled 2023-10-03: qty 30, 30d supply, fill #0

## 2023-10-05 DIAGNOSIS — M25561 Pain in right knee: Secondary | ICD-10-CM | POA: Diagnosis not present

## 2023-10-07 ENCOUNTER — Other Ambulatory Visit (HOSPITAL_COMMUNITY): Payer: Self-pay

## 2023-10-07 DIAGNOSIS — M25561 Pain in right knee: Secondary | ICD-10-CM | POA: Diagnosis not present

## 2023-10-07 MED ORDER — ONDANSETRON HCL 4 MG PO TABS
4.0000 mg | ORAL_TABLET | Freq: Four times a day (QID) | ORAL | 0 refills | Status: AC | PRN
  Filled 2023-10-07: qty 20, 5d supply, fill #0

## 2023-10-10 DIAGNOSIS — M25561 Pain in right knee: Secondary | ICD-10-CM | POA: Diagnosis not present

## 2023-10-10 DIAGNOSIS — G8918 Other acute postprocedural pain: Secondary | ICD-10-CM | POA: Diagnosis not present

## 2023-10-11 ENCOUNTER — Other Ambulatory Visit (HOSPITAL_COMMUNITY): Payer: Self-pay

## 2023-10-11 MED ORDER — TRAMADOL HCL 50 MG PO TABS
50.0000 mg | ORAL_TABLET | Freq: Four times a day (QID) | ORAL | 0 refills | Status: AC | PRN
  Filled 2023-10-11: qty 40, 5d supply, fill #0

## 2023-10-11 MED ORDER — PROMETHAZINE HCL 25 MG PO TABS
25.0000 mg | ORAL_TABLET | Freq: Four times a day (QID) | ORAL | 0 refills | Status: AC | PRN
  Filled 2023-10-11: qty 20, 5d supply, fill #0

## 2023-10-12 ENCOUNTER — Other Ambulatory Visit (HOSPITAL_COMMUNITY): Payer: Self-pay

## 2023-10-12 DIAGNOSIS — M25561 Pain in right knee: Secondary | ICD-10-CM | POA: Diagnosis not present

## 2023-10-17 DIAGNOSIS — M25561 Pain in right knee: Secondary | ICD-10-CM | POA: Diagnosis not present

## 2023-10-20 DIAGNOSIS — M25561 Pain in right knee: Secondary | ICD-10-CM | POA: Diagnosis not present

## 2023-10-24 DIAGNOSIS — M25561 Pain in right knee: Secondary | ICD-10-CM | POA: Diagnosis not present

## 2023-10-26 DIAGNOSIS — H903 Sensorineural hearing loss, bilateral: Secondary | ICD-10-CM | POA: Diagnosis not present

## 2023-10-27 ENCOUNTER — Telehealth (HOSPITAL_COMMUNITY): Payer: Self-pay

## 2023-10-27 DIAGNOSIS — M25561 Pain in right knee: Secondary | ICD-10-CM | POA: Diagnosis not present

## 2023-10-27 DIAGNOSIS — G8918 Other acute postprocedural pain: Secondary | ICD-10-CM | POA: Diagnosis not present

## 2023-10-27 NOTE — Telephone Encounter (Signed)
 I will call pt in 1 year to see if he would like to schedule f/u or wait 1 more year. AB

## 2023-10-27 NOTE — Telephone Encounter (Signed)
-----   Message from Kacie Sue-Ellen Matthews sent at 10/20/2023  3:53 PM EDT ----- Per Dr. Alvira Josephs, if continues to have no visual changes ok to repeat imaging in 1 year-- patient may extend to 2 years if he remains asymptomatic.   Thanks,  Kacie

## 2023-11-01 DIAGNOSIS — M25561 Pain in right knee: Secondary | ICD-10-CM | POA: Diagnosis not present

## 2023-11-02 DIAGNOSIS — Z5189 Encounter for other specified aftercare: Secondary | ICD-10-CM | POA: Diagnosis not present

## 2023-11-08 ENCOUNTER — Other Ambulatory Visit: Payer: Self-pay | Admitting: Physician Assistant

## 2023-11-08 ENCOUNTER — Other Ambulatory Visit: Payer: Self-pay | Admitting: Cardiology

## 2023-11-08 ENCOUNTER — Other Ambulatory Visit: Payer: Self-pay | Admitting: Neurology

## 2023-11-15 DIAGNOSIS — H02032 Senile entropion of right lower eyelid: Secondary | ICD-10-CM | POA: Diagnosis not present

## 2023-11-16 ENCOUNTER — Other Ambulatory Visit (HOSPITAL_COMMUNITY): Payer: Self-pay

## 2023-11-16 DIAGNOSIS — H02001 Unspecified entropion of right upper eyelid: Secondary | ICD-10-CM | POA: Diagnosis not present

## 2023-11-16 MED ORDER — NEOMYCIN-POLYMYXIN-DEXAMETH 3.5-10000-0.1 OP OINT
1.0000 | TOPICAL_OINTMENT | Freq: Every evening | OPHTHALMIC | 0 refills | Status: AC
  Filled 2023-11-16: qty 3.5, 3d supply, fill #0

## 2023-11-18 DIAGNOSIS — Z5189 Encounter for other specified aftercare: Secondary | ICD-10-CM | POA: Diagnosis not present

## 2023-11-21 DIAGNOSIS — H02003 Unspecified entropion of right eye, unspecified eyelid: Secondary | ICD-10-CM | POA: Diagnosis not present

## 2023-12-03 ENCOUNTER — Encounter (HOSPITAL_COMMUNITY): Payer: Self-pay | Admitting: Interventional Radiology

## 2024-02-03 ENCOUNTER — Other Ambulatory Visit: Payer: Self-pay | Admitting: Neurology

## 2024-02-03 ENCOUNTER — Other Ambulatory Visit: Payer: Self-pay | Admitting: Cardiology

## 2024-02-21 DIAGNOSIS — H0232 Blepharochalasis right lower eyelid: Secondary | ICD-10-CM | POA: Diagnosis not present

## 2024-02-21 DIAGNOSIS — Z01818 Encounter for other preprocedural examination: Secondary | ICD-10-CM | POA: Diagnosis not present

## 2024-02-21 DIAGNOSIS — H0234 Blepharochalasis left upper eyelid: Secondary | ICD-10-CM | POA: Diagnosis not present

## 2024-02-21 DIAGNOSIS — H57813 Brow ptosis, bilateral: Secondary | ICD-10-CM | POA: Diagnosis not present

## 2024-02-21 DIAGNOSIS — H0235 Blepharochalasis left lower eyelid: Secondary | ICD-10-CM | POA: Diagnosis not present

## 2024-02-21 DIAGNOSIS — H02532 Eyelid retraction right lower eyelid: Secondary | ICD-10-CM | POA: Diagnosis not present

## 2024-02-21 DIAGNOSIS — H0279 Other degenerative disorders of eyelid and periocular area: Secondary | ICD-10-CM | POA: Diagnosis not present

## 2024-02-21 DIAGNOSIS — H02032 Senile entropion of right lower eyelid: Secondary | ICD-10-CM | POA: Diagnosis not present

## 2024-02-21 DIAGNOSIS — H0231 Blepharochalasis right upper eyelid: Secondary | ICD-10-CM | POA: Diagnosis not present

## 2024-02-21 DIAGNOSIS — H02052 Trichiasis without entropian right lower eyelid: Secondary | ICD-10-CM | POA: Diagnosis not present

## 2024-03-07 ENCOUNTER — Other Ambulatory Visit (HOSPITAL_COMMUNITY): Payer: Self-pay

## 2024-03-07 ENCOUNTER — Other Ambulatory Visit: Payer: Self-pay

## 2024-03-07 MED ORDER — TICAGRELOR 90 MG PO TABS
90.0000 mg | ORAL_TABLET | Freq: Two times a day (BID) | ORAL | 1 refills | Status: AC
  Filled 2024-03-07: qty 180, 90d supply, fill #0

## 2024-03-20 ENCOUNTER — Other Ambulatory Visit (HOSPITAL_COMMUNITY): Payer: Self-pay

## 2024-03-20 ENCOUNTER — Other Ambulatory Visit (HOSPITAL_BASED_OUTPATIENT_CLINIC_OR_DEPARTMENT_OTHER): Payer: Self-pay

## 2024-03-20 MED ORDER — LOSARTAN POTASSIUM 100 MG PO TABS
100.0000 mg | ORAL_TABLET | Freq: Every day | ORAL | 0 refills | Status: AC
  Filled 2024-03-20 – 2024-03-21 (×2): qty 90, 90d supply, fill #0

## 2024-03-21 ENCOUNTER — Other Ambulatory Visit (HOSPITAL_BASED_OUTPATIENT_CLINIC_OR_DEPARTMENT_OTHER): Payer: Self-pay

## 2024-03-21 ENCOUNTER — Other Ambulatory Visit (HOSPITAL_COMMUNITY): Payer: Self-pay

## 2024-03-28 ENCOUNTER — Telehealth: Payer: Self-pay | Admitting: *Deleted

## 2024-03-28 NOTE — Telephone Encounter (Signed)
 Received medical clearance form for Dr. Rosemarie review/complete for right lower eyelid senile entropion and trichiasis repair scheduled for 04/23/24 from [facility].   Dr. Rosemarie out untl next week. Placed in MD office to review/complete.

## 2024-04-02 NOTE — Telephone Encounter (Signed)
 Faxed completed/signed form back to [facility]. Received confirmation.

## 2024-04-16 ENCOUNTER — Other Ambulatory Visit (HOSPITAL_COMMUNITY): Payer: Self-pay

## 2024-04-16 MED ORDER — NEOMYCIN-POLYMYXIN-DEXAMETH 3.5-10000-0.1 OP OINT
TOPICAL_OINTMENT | OPHTHALMIC | 0 refills | Status: AC
  Filled 2024-04-16: qty 3.5, 3d supply, fill #0

## 2024-04-23 DIAGNOSIS — H0234 Blepharochalasis left upper eyelid: Secondary | ICD-10-CM | POA: Diagnosis not present

## 2024-04-23 DIAGNOSIS — Z01818 Encounter for other preprocedural examination: Secondary | ICD-10-CM | POA: Diagnosis not present

## 2024-04-23 DIAGNOSIS — H0231 Blepharochalasis right upper eyelid: Secondary | ICD-10-CM | POA: Diagnosis not present

## 2024-04-23 DIAGNOSIS — H57813 Brow ptosis, bilateral: Secondary | ICD-10-CM | POA: Diagnosis not present

## 2024-04-23 DIAGNOSIS — H02032 Senile entropion of right lower eyelid: Secondary | ICD-10-CM | POA: Diagnosis not present

## 2024-04-23 DIAGNOSIS — H02532 Eyelid retraction right lower eyelid: Secondary | ICD-10-CM | POA: Diagnosis not present

## 2024-04-23 DIAGNOSIS — H0235 Blepharochalasis left lower eyelid: Secondary | ICD-10-CM | POA: Diagnosis not present

## 2024-04-23 DIAGNOSIS — H0279 Other degenerative disorders of eyelid and periocular area: Secondary | ICD-10-CM | POA: Diagnosis not present

## 2024-04-23 DIAGNOSIS — H02052 Trichiasis without entropian right lower eyelid: Secondary | ICD-10-CM | POA: Diagnosis not present

## 2024-04-23 DIAGNOSIS — H0232 Blepharochalasis right lower eyelid: Secondary | ICD-10-CM | POA: Diagnosis not present

## 2024-05-23 ENCOUNTER — Other Ambulatory Visit (HOSPITAL_COMMUNITY): Payer: Self-pay

## 2024-05-23 MED ORDER — HYDROCHLOROTHIAZIDE 12.5 MG PO TABS
12.5000 mg | ORAL_TABLET | Freq: Every day | ORAL | 1 refills | Status: AC
  Filled 2024-05-23: qty 90, 90d supply, fill #0

## 2024-07-03 ENCOUNTER — Other Ambulatory Visit: Payer: Self-pay

## 2024-07-03 ENCOUNTER — Other Ambulatory Visit (HOSPITAL_COMMUNITY): Payer: Self-pay

## 2024-07-03 MED ORDER — LOSARTAN POTASSIUM 100 MG PO TABS
100.0000 mg | ORAL_TABLET | Freq: Every day | ORAL | 1 refills | Status: AC
  Filled 2024-07-03 (×2): qty 90, 90d supply, fill #0
